# Patient Record
Sex: Male | Born: 1955 | Race: White | Hispanic: No | Marital: Married | Smoking: Never smoker
Health system: Southern US, Community
[De-identification: ages and names within clinical notes are randomized; demographics above are authoritative.]

## PROBLEM LIST (undated history)

## (undated) DIAGNOSIS — E785 Hyperlipidemia, unspecified: Secondary | ICD-10-CM

## (undated) DIAGNOSIS — D126 Benign neoplasm of colon, unspecified: Secondary | ICD-10-CM

## (undated) DIAGNOSIS — N189 Chronic kidney disease, unspecified: Secondary | ICD-10-CM

## (undated) DIAGNOSIS — I10 Essential (primary) hypertension: Secondary | ICD-10-CM

## (undated) DIAGNOSIS — E119 Type 2 diabetes mellitus without complications: Secondary | ICD-10-CM

## (undated) DIAGNOSIS — H269 Unspecified cataract: Secondary | ICD-10-CM

## (undated) DIAGNOSIS — E0590 Thyrotoxicosis, unspecified without thyrotoxic crisis or storm: Secondary | ICD-10-CM

## (undated) HISTORY — PX: POLYPECTOMY: SHX149

## (undated) HISTORY — PX: COLONOSCOPY: SHX174

## (undated) HISTORY — PX: VASECTOMY: SHX75

## (undated) HISTORY — PX: KIDNEY STONE SURGERY: SHX686

## (undated) HISTORY — PX: TONSILLECTOMY: SUR1361

## (undated) HISTORY — PX: HERNIA REPAIR: SHX51

## (undated) HISTORY — DX: Benign neoplasm of colon, unspecified: D12.6

## (undated) HISTORY — DX: Hyperlipidemia, unspecified: E78.5

## (undated) HISTORY — DX: Unspecified cataract: H26.9

## (undated) HISTORY — DX: Thyrotoxicosis, unspecified without thyrotoxic crisis or storm: E05.90

## (undated) HISTORY — DX: Type 2 diabetes mellitus without complications: E11.9

## (undated) HISTORY — DX: Essential (primary) hypertension: I10

---

## 1999-10-28 ENCOUNTER — Ambulatory Visit (HOSPITAL_COMMUNITY): Admission: RE | Admit: 1999-10-28 | Discharge: 1999-10-28 | Payer: Self-pay | Admitting: *Deleted

## 2005-12-22 ENCOUNTER — Ambulatory Visit: Payer: Self-pay | Admitting: Endocrinology

## 2006-01-09 ENCOUNTER — Ambulatory Visit: Payer: Self-pay | Admitting: Endocrinology

## 2006-01-13 ENCOUNTER — Ambulatory Visit: Payer: Self-pay | Admitting: Endocrinology

## 2006-02-06 ENCOUNTER — Ambulatory Visit: Payer: Self-pay | Admitting: Endocrinology

## 2006-02-10 ENCOUNTER — Ambulatory Visit: Payer: Self-pay | Admitting: Endocrinology

## 2006-04-14 ENCOUNTER — Ambulatory Visit: Payer: Self-pay | Admitting: Endocrinology

## 2006-04-14 LAB — CONVERTED CEMR LAB: TSH: 0.86 microintl units/mL (ref 0.35–5.50)

## 2006-04-15 ENCOUNTER — Ambulatory Visit: Payer: Self-pay | Admitting: Endocrinology

## 2006-07-16 ENCOUNTER — Ambulatory Visit: Payer: Self-pay | Admitting: Endocrinology

## 2006-07-16 LAB — CONVERTED CEMR LAB: TSH: 1.64 microintl units/mL (ref 0.35–5.50)

## 2006-11-17 ENCOUNTER — Ambulatory Visit: Payer: Self-pay | Admitting: Endocrinology

## 2006-12-24 ENCOUNTER — Ambulatory Visit: Payer: Self-pay | Admitting: Internal Medicine

## 2007-06-29 ENCOUNTER — Ambulatory Visit: Payer: Self-pay | Admitting: Endocrinology

## 2007-06-29 DIAGNOSIS — E0590 Thyrotoxicosis, unspecified without thyrotoxic crisis or storm: Secondary | ICD-10-CM

## 2007-06-29 DIAGNOSIS — I10 Essential (primary) hypertension: Secondary | ICD-10-CM

## 2007-07-07 LAB — CONVERTED CEMR LAB: TSH: 1.23 microintl units/mL (ref 0.35–5.50)

## 2007-10-07 ENCOUNTER — Emergency Department (HOSPITAL_COMMUNITY): Admission: EM | Admit: 2007-10-07 | Discharge: 2007-10-07 | Payer: Self-pay | Admitting: Emergency Medicine

## 2008-01-04 ENCOUNTER — Ambulatory Visit: Payer: Self-pay | Admitting: Endocrinology

## 2008-01-06 LAB — CONVERTED CEMR LAB: TSH: 1.38 microintl units/mL (ref 0.35–5.50)

## 2008-08-15 ENCOUNTER — Ambulatory Visit: Payer: Self-pay | Admitting: Endocrinology

## 2008-08-15 DIAGNOSIS — E291 Testicular hypofunction: Secondary | ICD-10-CM

## 2008-08-15 LAB — CONVERTED CEMR LAB
ALT: 41 units/L (ref 0–53)
AST: 31 units/L (ref 0–37)
Albumin: 4.2 g/dL (ref 3.5–5.2)
Alkaline Phosphatase: 74 units/L (ref 39–117)
BUN: 17 mg/dL (ref 6–23)
Basophils Absolute: 0 10*3/uL (ref 0.0–0.1)
Basophils Relative: 0.4 % (ref 0.0–3.0)
Bilirubin, Direct: 0.2 mg/dL (ref 0.0–0.3)
CO2: 27 meq/L (ref 19–32)
Calcium: 9.3 mg/dL (ref 8.4–10.5)
Chloride: 101 meq/L (ref 96–112)
Cholesterol: 180 mg/dL (ref 0–200)
Creatinine, Ser: 1.1 mg/dL (ref 0.4–1.5)
Eosinophils Absolute: 0.1 10*3/uL (ref 0.0–0.7)
Eosinophils Relative: 1.5 % (ref 0.0–5.0)
FSH: 2.3 milliintl units/mL (ref 1.4–18.1)
GFR calc non Af Amer: 74.75 mL/min (ref >60)
Glucose, Bld: 117 mg/dL — ABNORMAL HIGH (ref 70–99)
HCT: 44.4 % (ref 39.0–52.0)
HDL: 34.7 mg/dL — ABNORMAL LOW (ref >39.00)
Hemoglobin: 14.9 g/dL (ref 13.0–17.0)
LDL Cholesterol: 121 mg/dL — ABNORMAL HIGH (ref 0–99)
LH: 1.42 milliintl units/mL — ABNORMAL LOW (ref 1.50–9.30)
Lymphocytes Relative: 23.2 % (ref 12.0–46.0)
Lymphs Abs: 1.8 10*3/uL (ref 0.7–4.0)
MCHC: 33.6 g/dL (ref 30.0–36.0)
MCV: 90.2 fL (ref 78.0–100.0)
Monocytes Absolute: 0.5 10*3/uL (ref 0.1–1.0)
Monocytes Relative: 6.7 % (ref 3.0–12.0)
Neutro Abs: 5.4 10*3/uL (ref 1.4–7.7)
Neutrophils Relative %: 68.2 % (ref 43.0–77.0)
PSA: 0.88 ng/mL (ref 0.10–4.00)
Platelets: 214 10*3/uL (ref 150.0–400.0)
Potassium: 4.1 meq/L (ref 3.5–5.1)
Prolactin: 4.7 ng/mL
RBC: 4.92 M/uL (ref 4.22–5.81)
RDW: 12.6 % (ref 11.5–14.6)
Sodium: 138 meq/L (ref 135–145)
TSH: 0.9 microintl units/mL (ref 0.35–5.50)
Testosterone: 278.97 ng/dL — ABNORMAL LOW (ref 350.00–890.00)
Total Bilirubin: 0.8 mg/dL (ref 0.3–1.2)
Total CHOL/HDL Ratio: 5
Total Protein: 7.1 g/dL (ref 6.0–8.3)
Triglycerides: 120 mg/dL (ref 0.0–149.0)
VLDL: 24 mg/dL (ref 0.0–40.0)
WBC: 7.8 10*3/uL (ref 4.5–10.5)

## 2010-02-13 ENCOUNTER — Encounter: Payer: Self-pay | Admitting: Endocrinology

## 2010-02-13 ENCOUNTER — Ambulatory Visit: Payer: Self-pay | Admitting: Endocrinology

## 2010-02-13 DIAGNOSIS — K769 Liver disease, unspecified: Secondary | ICD-10-CM | POA: Insufficient documentation

## 2010-02-13 DIAGNOSIS — E7800 Pure hypercholesterolemia, unspecified: Secondary | ICD-10-CM | POA: Insufficient documentation

## 2010-02-13 HISTORY — DX: Liver disease, unspecified: K76.9

## 2010-02-18 ENCOUNTER — Telehealth: Payer: Self-pay | Admitting: Endocrinology

## 2010-02-19 LAB — CONVERTED CEMR LAB
AST: 50 units/L — ABNORMAL HIGH (ref 0–37)
Alkaline Phosphatase: 100 units/L (ref 39–117)
BUN: 17 mg/dL (ref 6–23)
Basophils Absolute: 0.1 10*3/uL (ref 0.0–0.1)
Bilirubin, Direct: 0.1 mg/dL (ref 0.0–0.3)
Calcium: 9.8 mg/dL (ref 8.4–10.5)
Cholesterol: 191 mg/dL (ref 0–200)
GFR calc non Af Amer: 62.96 mL/min (ref >60)
Glucose, Bld: 284 mg/dL — ABNORMAL HIGH (ref 70–99)
HDL: 37.9 mg/dL — ABNORMAL LOW (ref >39.00)
LDL Cholesterol: 117 mg/dL — ABNORMAL HIGH (ref 0–99)
Lymphocytes Relative: 22.8 % (ref 12.0–46.0)
Monocytes Relative: 6.3 % (ref 3.0–12.0)
Neutrophils Relative %: 68.8 % (ref 43.0–77.0)
PSA: 1.14 ng/mL (ref 0.10–4.00)
Platelets: 199 10*3/uL (ref 150.0–400.0)
RDW: 12.8 % (ref 11.5–14.6)
Specific Gravity, Urine: 1.02 (ref 1.000–1.030)
TSH: 0.94 microintl units/mL (ref 0.35–5.50)
Total Bilirubin: 0.5 mg/dL (ref 0.3–1.2)
Total Protein, Urine: NEGATIVE mg/dL
Urine Glucose: >=1000 mg/dL
Urobilinogen, UA: 0.2 (ref 0.0–1.0)
VLDL: 36.2 mg/dL (ref 0.0–40.0)
WBC: 8.6 10*3/uL (ref 4.5–10.5)
pH: 6 (ref 5.0–8.0)

## 2010-02-20 ENCOUNTER — Ambulatory Visit: Payer: Self-pay | Admitting: Endocrinology

## 2010-06-02 ENCOUNTER — Encounter: Payer: Self-pay | Admitting: Endocrinology

## 2010-06-11 NOTE — Assessment & Plan Note (Signed)
Summary: FU/ DIDN'T WANT A CPX/ LOV 08-2008 /[facility]   Vital Signs:  Patient profile:   55 year old male Height:      67 inches Weight:      187.38 pounds BMI:     29.45 O2 Sat:      96 % Temp:     98.1 degrees F oral Pulse rate:   84 / minute BP sitting:   140 / 70  (left arm) Cuff size:   regular  Vitals Entered By: Jarome Lamas (February 13, 2010 8:15 AM) CC: follow-up visit   CC:  follow-up visit.  History of Present Illness: the status of at least 3 ongoing medical problems is addressed today: hyperthyroidism:  this has been present x approx 11 years.  he chose to stay on thionamide rx, and has tolerated well. dyslipidemia:  this has been noted in the past, but he has not agreed to take medication thus far.  he denies chest pain. hypogonadism:  he declined clomid in the past.  no change in sxs.    Current Medications (verified): 1)  Methimazole 10 Mg Tabs (Methimazole) .... Qd 2)  Zestoretic 20-25 Mg Tabs (Lisinopril-Hydrochlorothiazide) .... Take 1 Tablet By Mouth Once A Day 3)  Levitra 20 Mg Tabs (Vardenafil Hcl) .... As Needed Use  Allergies (verified): No Known Drug Allergies  Past History:  Past Medical History: Last updated: 01/04/2008 HYPERTENSION (ICD-401.9) HYPERTHYROIDISM (ICD-242.90)  Family History: Reviewed history from 08/15/2008 and no changes required. mother (smoker) has "throat cancer." son committed suicide 2010.  Social History: Reviewed history from 08/15/2008 and no changes required. works as Radio broadcast assistant married.  Review of Systems  The patient denies dyspnea on exertion.         denies rash  Physical Exam  General:  normal appearance.   Neck:  Supple without thyroid enlargement or tenderness.  Lungs:  Clear to auscultation bilaterally. Normal respiratory effort.  Heart:  Regular rate and rhythm without murmurs or gallops noted. Normal S1,S2.   Additional Exam:  FastTSH                   0.94 uIU/mL      Testosterone          [L]  193.23 ng/dL      LDL Cholesterol      [H]  811 mg/dL        AST                  [H]  50 U/L                      0-37   ALT                  [H]  54 U/L         Impression & Recommendations:  Problem # 1:  HYPERTHYROIDISM (ICD-242.90) well-controlled  Problem # 2:  HYPERCHOLESTEROLEMIA (ICD-272.0) needs increased rx  Problem # 3:  HYPOGONADISM (ICD-257.2) persistent  Problem # 4:  UNSPECIFIED DISORDER OF LIVER (ICD-573.9) Assessment: New  Other Orders: EKG w/ Interpretation (93000) TLB-Lipid Panel (80061-LIPID) TLB-BMP (Basic Metabolic Panel-BMET) (80048-METABOL) TLB-CBC Platelet - w/Differential (85025-CBCD) TLB-Hepatic/Liver Function Pnl (80076-HEPATIC) TLB-TSH (Thyroid Stimulating Hormone) (84443-TSH) TLB-Testosterone, Total (84403-TESTO) TLB-Udip w/ Micro (81001-URINE) TLB-PSA (Prostate Specific Antigen) (84153-PSA) Est. Patient Level IV (91478)  Patient Instructions: 1)  blood tests are being ordered for you today.  please call [phone] to hear your test results. 2)  pending  the test results, please continue the same medications for now. 3)  if ever you have fever while taking this methimazole, stop it and call us, because of the risk of a rare side-effect. 4)  Please schedule a follow-up appointment in 3 months. 5)  update: pt is called.  please consider med for chol, testosterone.  go to lab for repeat liver panel, hepatitis-c antibody, hepatitis-b surface antigen, and hepatitis-b surface antibody 573.9

## 2010-06-11 NOTE — Progress Notes (Signed)
Summary: Lab results/SAE pt  Phone Note Call from Patient   Caller: Patient [phone] c Summary of Call: Pt called requesting Lab results from CPX last week. Can MD review and advise? Initial call taken by: Margaret Pyle, CMA,  February 18, 2010 9:06 AM  Follow-up for Phone Call        prelim review shows mild elev chol, slight elevation of liver testss, and low testosterone level -   Results Not urgent at this time;  ok to wait for Dr Everardo All to address on return Follow-up by: Corwin Levins MD,  February 18, 2010 9:09 AM  Additional Follow-up for Phone Call Additional follow up Details #1::        Pt advised of above and refills. Pt will wait for SAE return. Additional Follow-up by: Margaret Pyle, CMA,  February 18, 2010 9:28 AM    Additional Follow-up for Phone Call Additional follow up Details #2::    please call patient: testosterone is still low, and cholesterol is high.  please consider a pill for each of these.  also, liver is slightly off--this usually resolves on its own.  please come in for labs (listed on ov, and sent to [facility]ancy for entry into idx) Follow-up by: Minus Breeding MD,  February 19, 2010 6:51 PM  Additional Follow-up for Phone Call Additional follow up Details #3:: Details for Additional Follow-up Action Taken: Pt advised of above, declined medications for chol and testosterone at this time. Labs scheduled and pt aware. Additional Follow-up by: Margaret Pyle, CMA,  February 20, 2010 8:59 AM  Prescriptions: LEVITRA 20 MG TABS (VARDENAFIL HCL) as needed use  #3 x 11   Entered by:   Margaret Pyle, CMA   Authorized by:   Minus Breeding MD   Signed by:   Margaret Pyle, CMA on 02/18/2010   Method used:   Electronically to        [facility]  [address] [phone]* (retail)       [address]       [place], [place]  [address]       Ph: [phone]       Fax: [phone]   RxID:   [id number] ZESTORETIC  20-25 MG TABS (LISINOPRIL-HYDROCHLOROTHIAZIDE) Take 1 tablet by mouth once a day  #30 Tablet x 11   Entered by:   Margaret Pyle, CMA   Authorized by:   Minus Breeding MD   Signed by:   Margaret Pyle, CMA on 02/18/2010   Method used:   Electronically to        [facility]  [address] [phone]* (retail)       [address]       [place], [place]  [address]       Ph: [phone]       Fax: [phone]   RxID:   [id number]

## 2010-09-27 NOTE — Consult Note (Signed)
[facility]                            ENDOCRINOLOGY CONSULTATION   TRIGG, DELAROCHA                      MRN:          [id number]  DATE:12/23/2005                            DOB:          January 17, 1956    REFERRING PHYSICIAN:  Oley Balm. Georgina Pillion, MD   REASON FOR VISIT:  Hyperthyroidism.   HISTORY OF PRESENT ILLNESS:  A 55 year old man with an approximately 7 years  history of hyperthyroidism. He was treated initially with Thionamide  medications and he improved. He declined Iodine-131 therapy. He went off the  medication altogether about 2 years ago and now, he has several years of  tremor of his hands with associated moderate heat intolerance.   PAST MEDICAL HISTORY:  Hypertension, for which he takes Lisinopril/HCTZ.   SOCIAL HISTORY:  He works as a Visual merchandiser. He is married.   FAMILY HISTORY:  Negative for thyroid disease.   REVIEW OF SYSTEMS:  Denies the following:  Fever, weight gain, weight loss,  syncope, palpitations, shortness of breath, nausea, vomiting, itching, and  anxiety.   PHYSICAL EXAMINATION:  VITAL SIGNS:  Blood pressure 137/76, heart rate 119,  temperature 98.3, weight 176.  GENERAL:  No distress.  SKIN:  Not diaphoretic.  HEENT:  Eyes, no proptosis. No periorbital swelling.  NECK:  The thyroid is slightly and diffusely enlarged.  CHEST:  Clear to auscultation. No respiratory distress.  CARDIOVASCULAR:  No JVD. No edema. Regular rate and rhythm. No murmur.  NEUROLOGIC:  Gait is observed in the office to be normal. Alert and  oriented. Does not appear anxious nor depressed and there is no tremor.   LABORATORY DATA:  Forwarded by Dr. Georgina Pillion. On October 20, 2005, TSH 0.02. Free  T4 1.69, which is elevated.   IMPRESSION:  1. Apparent history of Graves' disease.  2. History of hyperthyroidism due to Graves' disease.  3. Hypertension, for which he takes Lisinopril/HCTZ.   PLAN:  1. We discussed the natural  history, risks, and treatment options for his      hyperthyroidism, presumably due to Graves' disease.  2. He states that he wants to resume Thionamide therapy for now, so he      will take Tapazole 20 mg twice a day.  3. Return in 3 weeks.  4. I told him because of the rare side effect of Tapazole, if he has      fever, he must discontinue it and call us, so we can check a CBC.  5. I considered beta blockade but I hesitate to slow the heart rate of      somebody who works in a warm environment in the summer time, as this      could increase his risk for syncope.                                   Sean A. Everardo All, MD   SAE/MedQ  DD:  12/23/2005  DT:  12/23/2005  Job #:  [id number]

## 2010-09-27 NOTE — Consult Note (Signed)
[facility]                            ENDOCRINOLOGY CONSULTATION   HENRY, UTSEY                      MRN:          [id number]  DATE:02/10/2006                            DOB:          07-22-1955    REASON FOR VISIT:  Follow up thyroid.   HISTORY OF PRESENT ILLNESS:  [age]-year-old man who states he feels no  different, and well in general.   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   FAMILY HISTORY:  His brother has type 1 diabetes.   REVIEW OF SYSTEMS:  Denies any change in his weight.   PHYSICAL EXAMINATION:  Blood pressure 103/68, heart rate 80, temperature  97.0, weight 174.  GENERAL:  No distress.  NECK:  Small multinodular goiter.  NEUROLOGIC:  Alert, well-oriented.  Does not appear anxious nor depressed,  and there is no tremor.   LABORATORY DATA:  Hemoglobin A1c 5.8, TSH 0.02, free T4 0.8.   IMPRESSION:  1. Continued improvement in his hyperthyroidism.  2. Hyperglycemia.   PLAN:  1. Decrease Tapazole to 10 mg a day.  2. Diet and exercise therapy is advised.  3. Return in about 6 weeks.            ______________________________  Cleophas Dunker Everardo All, MD     SAE/MedQ  DD:  02/11/2006  DT:  02/12/2006  Job #:  [id number]

## 2010-09-27 NOTE — Consult Note (Signed)
[facility]                          ENDOCRINOLOGY CONSULTATION   Andrew Morrow, Andrew Morrow                      MRN:          [id number]  DATE:04/15/2006                            DOB:          1956/03/04    REASON FOR VISIT:  Followup thyroid.   HISTORY OF PRESENT ILLNESS:  A 55 year old man whose Tapazole is 10 mg a  day.  He states he feels no different, and well in general.   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   PAST MEDICAL HISTORY:  Otherwise healthy, except for hypertension for  which he takes Zestoretic 20/12.5, one daily.   REVIEW OF SYSTEMS:  Denies fever.   PHYSICAL EXAMINATION:  VITAL SIGNS:  Blood pressure 122/80, heart rate  is 79, temperature 97.9.  The weight is 177.  GENERAL:  No distress.  NECK:  Small multi-nodular goiter.   LABORATORY STUDIES:  On 04/14/2006, TSH 0.86, hemoglobin A1c 5.9.   IMPRESSION:  1. Well-controlled hypertension.  2. Well-controlled hyperthyroidism, due to a multi-nodular goiter      which is usually hereditary.  3. Hyperglycemia.  No evidence of dibs.   PLAN:  1. Same amount of Tapazole and Zestoretic.  2. Return in 3 months.  3. 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     Sean A. Everardo All, MD  Electronically Signed    SAE/MedQ  DD: 04/15/2006  DT: 04/16/2006  Job #: [id number]   cc:   Andrew Morrow

## 2010-09-27 NOTE — Consult Note (Signed)
[facility]                            ENDOCRINOLOGY CONSULTATION   Andrew Morrow                      MRN:          [id number]  DATE:01/13/2006                            DOB:          [birth year]    REASON FOR VISIT:  Followup thyroid.   HISTORY OF PRESENT ILLNESS:  A 55 year old man with hyperthyroidism.  He is  back on Tapazole 20 mg twice daily and feels better.  He denies tremor and  palpitations.  He also states six months of ED.   PAST MEDICAL HISTORY:  Same as December 23, 2005.   REVIEW OF SYSTEMS:  Denies fever.   PHYSICAL EXAMINATION:  VITAL SIGNS:  Blood pressure 124/75, heart rate 94,  temperature 97.4.  The weight is 178.  GENERAL:  In no distress.  NECK:  Small multinodular goiter.  SKIN:  Not diaphoretic.  NEUROLOGIC:  Alert and oriented.  Does not appear anxious or depressed.  A  minimal postural tremor present.   LABORATORY STUDIES:  On January 09, 2006, free T4 1.4.  TSH 0.03.  Testosterone 299.58.  FSH 2.5, LH 1.9, prolactin 6.4.   IMPRESSION:  1. Hyperthyroidism, improved.  2. Mild hypogonadism.   PLAN:  1. Decrease Tapazole to 10 mg twice daily.  2. I advise against testosterone supplementation in this setting.  I have      told him it is fine for him to take Viagra, and I gave him a few      samples.  3. Return in six weeks with thyroid function studies prior.                                   Sean A. Everardo All, MD   SAE/MedQ  DD:  01/14/2006  DT:  01/14/2006  Job #:  [id number]   cc:   Oley Balm. Georgina Pillion, M.D.

## 2010-11-27 ENCOUNTER — Telehealth: Payer: Self-pay | Admitting: *Deleted

## 2010-11-27 NOTE — Telephone Encounter (Signed)
i have several patients with this last name.  Please verify, as this pt does not have dm

## 2010-11-27 NOTE — Telephone Encounter (Signed)
Pt c/o elevated BSL. [362] this AM, then went to [500] at 10:15am Do you want OV for pt?

## 2010-11-28 ENCOUNTER — Encounter: Payer: Self-pay | Admitting: *Deleted

## 2010-11-28 ENCOUNTER — Ambulatory Visit (INDEPENDENT_AMBULATORY_CARE_PROVIDER_SITE_OTHER): Payer: 59 | Admitting: Endocrinology

## 2010-11-28 ENCOUNTER — Other Ambulatory Visit (INDEPENDENT_AMBULATORY_CARE_PROVIDER_SITE_OTHER): Payer: 59

## 2010-11-28 ENCOUNTER — Encounter: Payer: Self-pay | Admitting: Endocrinology

## 2010-11-28 ENCOUNTER — Telehealth: Payer: Self-pay | Admitting: *Deleted

## 2010-11-28 VITALS — BP 140/82 | HR 86 | Temp 98.6°F | Ht 67.0 in | Wt 179.2 lb

## 2010-11-28 DIAGNOSIS — E109 Type 1 diabetes mellitus without complications: Secondary | ICD-10-CM

## 2010-11-28 DIAGNOSIS — E0590 Thyrotoxicosis, unspecified without thyrotoxic crisis or storm: Secondary | ICD-10-CM

## 2010-11-28 LAB — BASIC METABOLIC PANEL
Chloride: 94 mEq/L — ABNORMAL LOW (ref 96–112)
Creatinine, Ser: 1.1 mg/dL (ref 0.4–1.5)
Sodium: 133 mEq/L — ABNORMAL LOW (ref 135–145)

## 2010-11-28 MED ORDER — GLUCOSE BLOOD VI STRP: 1.0000 | ORAL_STRIP | Freq: Four times a day (QID) | Status: DC

## 2010-11-28 MED ORDER — INSULIN GLARGINE 100 UNIT/ML ~~LOC~~ SOLN: 10.0000 [IU] | Freq: Every day | SUBCUTANEOUS | Status: DC

## 2010-11-28 NOTE — Patient Instructions (Addendum)
Please start lantus 10 units daily. Refer to a diabetes educator to learn about injections.  you will be called with a day and time for an appointment. Here is a meter to check your blood sugar. check your blood sugar 4 times a day--before the 3 meals, and at bedtime.  also check if you have symptoms of your blood sugar being too high or too low.  please keep a record of the readings and bring it to your next appointment here.  please call us sooner if you are having low blood sugar episodes. Return here in 4 days. blood tests are being ordered for you today.  please call [phone] to hear your test results.  You will be prompted to enter the 9-digit "MRN" number that appears at the top left of this page, followed by #.  Then you will hear the message. We'll recheck blood pressure when you feel better.

## 2010-11-28 NOTE — Progress Notes (Signed)
  Subjective:    Patient ID: Andrew Morrow, male    DOB: [birth year], 55 y.o.   MRN: [id number]  HPI Pt states few weeks of slight dryness of the mouth and assoc polyuria.  Denies sob and fever. Past Medical History  Diagnosis Date  . HTN (hypertension)   . Hyperthyroidism     Past Surgical History  Procedure Date  . None reported     History   Social History  . Marital Status: Married    Spouse Name: N/A    Number of Children: N/A  . Years of Education: N/A   Occupational History  . Not on file.   Social History Main Topics  . Smoking status: Never Smoker   . Smokeless tobacco: Not on file  . Alcohol Use: Not on file  . Drug Use: Not on file  . Sexually Active: Not on file   Other Topics Concern  . Not on file   Social History Narrative   Works as Nurse, adult    No current outpatient prescriptions on file prior to visit.    No Known Allergies  Family History  Problem Relation Age of Onset  . Throat cancer Mother   . Other Son     Committed Suicide  brother had type 1 dm.  BP 140/82  Pulse 86  Temp(Src) 98.6 F (37 C) (Oral)  Ht 5\' 7"  (1.702 m)  Wt 179 lb 3.2 oz (81.285 kg)  BMI 28.07 kg/m2  SpO2 96%  Review of Systems He has lost 8 lbs since last ov.  Denies n/v    Objective:   Physical Exam GENERAL: no distress LUNGS:  Clear to auscultation HEART: Regular rate and rhythm without murmurs noted. Normal S1,S2.   Pulses: dorsalis pedis intact bilat.   Feet: no deformity.  no ulcer on the feet.  feet are of normal color and temp.  no edema Neuro: sensation is intact to touch on the feet   Labs: glucose=422 Lab Results  Component Value Date   TSH 1.13 11/28/2010   Assessment & Plan:  Dm, worse. i have demonstrated insulin pen today Htn, ? Situational component. Hypothyroidism, well-replaced

## 2010-11-28 NOTE — Telephone Encounter (Signed)
Forwarded to scheduling for appointment.

## 2010-11-28 NOTE — Telephone Encounter (Signed)
Same Day Abstraction. 

## 2010-11-29 ENCOUNTER — Ambulatory Visit: Payer: Self-pay | Admitting: Endocrinology

## 2010-12-02 ENCOUNTER — Other Ambulatory Visit: Payer: Self-pay | Admitting: Endocrinology

## 2010-12-02 ENCOUNTER — Ambulatory Visit (INDEPENDENT_AMBULATORY_CARE_PROVIDER_SITE_OTHER): Payer: 59 | Admitting: Endocrinology

## 2010-12-02 ENCOUNTER — Encounter: Payer: Self-pay | Admitting: Endocrinology

## 2010-12-02 DIAGNOSIS — E109 Type 1 diabetes mellitus without complications: Secondary | ICD-10-CM

## 2010-12-02 NOTE — Progress Notes (Signed)
  Subjective:    Patient ID: Andrew Morrow, male    DOB: [birth year], 55 y.o.   MRN: [id number]  HPI pt states he feels less tired in general.  On lantus 10 unit qd, cbg's are mostly in the 300's.  There is no trend throughout the day.  He has no trouble using the pen. Past Medical History  Diagnosis Date  . HTN (hypertension)   . Hyperthyroidism     Past Surgical History  Procedure Date  . None reported     History   Social History  . Marital Status: Married    Spouse Name: N/A    Number of Children: N/A  . Years of Education: N/A   Occupational History  . Not on file.   Social History Main Topics  . Smoking status: Never Smoker   . Smokeless tobacco: Not on file  . Alcohol Use: Not on file  . Drug Use: Not on file  . Sexually Active: Not on file   Other Topics Concern  . Not on file   Social History Narrative   Works as Nurse, adult    Current Outpatient Prescriptions on File Prior to Visit  Medication Sig Dispense Refill  . glucose blood (ONE TOUCH ULTRA TEST) test strip 1 each by Other route 4 (four) times daily. And lancets 250.03  100 each  12  . insulin glargine (LANTUS SOLOSTAR) 100 UNIT/ML injection Inject 10 Units into the skin at bedtime. And pen needles, 1 daily  15 mL  12  . lisinopril-hydrochlorothiazide (PRINZIDE,ZESTORETIC) 20-25 MG per tablet Take 1 tablet by mouth daily.        . methimazole (TAPAZOLE) 10 MG tablet Take 10 mg by mouth daily.        . vardenafil (LEVITRA) 20 MG tablet Take 20 mg by mouth daily as needed.          No Known Allergies  Family History  Problem Relation Age of Onset  . Throat cancer Mother   . Other Son     Committed Suicide    BP 130/82  Pulse 92  Temp(Src) 98.5 F (36.9 C) (Oral)  Ht 5\' 7"  (1.702 m)  Wt 179 lb (81.194 kg)  BMI 28.04 kg/m2  SpO2 96% Review of Systems Denies n/v    Objective:   Physical Exam GENERAL: no distress Gait, normal and steady.    Assessment & Plan:  Type  1 dm, needs increased rx

## 2010-12-02 NOTE — Patient Instructions (Addendum)
Increase lantus to 15 units daily.  Then if necessary, increase to 20 units daily, and then to 25.  Our goal is to get the blood sugar in the low-100's, at some time of day.   Please make a follow-up appointment in 1 week.   check your blood sugar 4 times a day--before the 3 meals, and at bedtime.  also check if you have symptoms of your blood sugar being too high or too low.  please keep a record of the readings and bring it to your next appointment here.  please call us sooner if you are having low blood sugar episodes.

## 2010-12-03 ENCOUNTER — Other Ambulatory Visit: Payer: Self-pay

## 2010-12-03 MED ORDER — ONETOUCH DELICA LANCETS MISC: 1.0000 | Freq: Four times a day (QID) | Status: DC

## 2010-12-10 ENCOUNTER — Ambulatory Visit (INDEPENDENT_AMBULATORY_CARE_PROVIDER_SITE_OTHER): Payer: 59 | Admitting: Endocrinology

## 2010-12-10 ENCOUNTER — Encounter: Payer: Self-pay | Admitting: Endocrinology

## 2010-12-10 VITALS — BP 134/78 | HR 83 | Temp 98.4°F | Ht 67.0 in | Wt 179.0 lb

## 2010-12-10 DIAGNOSIS — E109 Type 1 diabetes mellitus without complications: Secondary | ICD-10-CM

## 2010-12-10 MED ORDER — INSULIN LISPRO 100 UNIT/ML ~~LOC~~ SOLN: 4.0000 [IU] | Freq: Three times a day (TID) | SUBCUTANEOUS | Status: DC

## 2010-12-10 NOTE — Patient Instructions (Addendum)
reduce lantus to 5 units daily.   Start novolog 4 units 3x a day (just before each meal). Please make a follow-up appointment in 1 week.   check your blood sugar 4 times a day--before the 3 meals, and at bedtime.  also check if you have symptoms of your blood sugar being too high or too low.  please keep a record of the readings and bring it to your next appointment here.  please call us sooner if you are having low blood sugar episodes.

## 2010-12-10 NOTE — Progress Notes (Signed)
  Subjective:    Patient ID: Andrew Morrow, male    DOB: 19-Apr-1956, 55 y.o.   MRN: [id number]  HPI Pt feels better since last ov.  He takes lantus 15 units qd.  no cbg record, but states cbg's vary from 100-200.  It is in general higher as the day goes on.   Past Medical History  Diagnosis Date  . HTN (hypertension)   . Hyperthyroidism     Past Surgical History  Procedure Date  . None reported     History   Social History  . Marital Status: Married    Spouse Name: N/A    Number of Children: N/A  . Years of Education: N/A   Occupational History  . Not on file.   Social History Main Topics  . Smoking status: Never Smoker   . Smokeless tobacco: Not on file  . Alcohol Use: Not on file  . Drug Use: Not on file  . Sexually Active: Not on file   Other Topics Concern  . Not on file   Social History Narrative   Works as Nurse, adult    Current Outpatient Prescriptions on File Prior to Visit  Medication Sig Dispense Refill  . glucose blood (ONE TOUCH ULTRA TEST) test strip 1 each by Other route 4 (four) times daily. And lancets 250.03  100 each  12  . insulin glargine (LANTUS) 100 UNIT/ML injection Inject 5 Units into the skin at bedtime. And pen needles, 1 daily      . lisinopril-hydrochlorothiazide (PRINZIDE,ZESTORETIC) 20-25 MG per tablet Take 1 tablet by mouth daily.        . methimazole (TAPAZOLE) 10 MG tablet TAKE 1 TABLET EVERYDAY  120 tablet  1  . ONETOUCH DELICA LANCETS MISC 1 each by Does not apply route 4 (four) times daily.  400 each  1  . vardenafil (LEVITRA) 20 MG tablet Take 20 mg by mouth daily as needed.         No Known Allergies  Family History  Problem Relation Age of Onset  . Throat cancer Mother   . Other Son     Committed Suicide    BP 134/78  Pulse 83  Temp(Src) 98.4 F (36.9 C) (Oral)  Ht 5\' 7"  (1.702 m)  Wt 179 lb (81.194 kg)  BMI 28.04 kg/m2  SpO2 96%  Review of Systems denies hypoglycemia    Objective:   Physical Exam GENERAL: no distress Gait: normal and steady    Assessment & Plan:  Adult-onset type 1 dm.  he needs to start mealtime insulin.

## 2011-01-09 ENCOUNTER — Ambulatory Visit (INDEPENDENT_AMBULATORY_CARE_PROVIDER_SITE_OTHER): Payer: 59 | Admitting: Endocrinology

## 2011-01-09 ENCOUNTER — Encounter: Payer: Self-pay | Admitting: Endocrinology

## 2011-01-09 DIAGNOSIS — E109 Type 1 diabetes mellitus without complications: Secondary | ICD-10-CM

## 2011-01-09 NOTE — Patient Instructions (Addendum)
reduce lantus to 4 units daily.   continue novolog 3x a day (just before each meal), 4-4-5 units.   Please make a follow-up appointment in 1 month.   check your blood sugar 4 times a day--before the 3 meals, and at bedtime.  also check if you have symptoms of your blood sugar being too high or too low.  please keep a record of the readings and bring it to your next appointment here.  please call us sooner if you are having low blood sugar episodes.

## 2011-01-09 NOTE — Progress Notes (Signed)
  Subjective:    Patient ID: Andrew Morrow, male    DOB: 1955-07-15, 55 y.o.   MRN: [id number]  HPI    Review of Systems     Objective:   Physical Exam        Assessment & Plan:  Dm, he needs some adjustment in his therapy

## 2011-01-09 NOTE — Progress Notes (Signed)
  Subjective:    Patient ID: Andrew Morrow, male    DOB: 1955/08/28, 55 y.o.   MRN: [id number]  HPI pt states she feels well in general.  he brings a record of his cbg's which i have reviewed today.  It varies from 90-150.  It is highest at hs, and lowest in the afternoon.     Review of Systems denies hypoglycemia.      Objective:   Physical Exam VITAL SIGNS:  See vs page GENERAL: no distress.       Assessment & Plan:

## 2011-02-05 ENCOUNTER — Encounter: Payer: Self-pay | Admitting: Endocrinology

## 2011-02-05 ENCOUNTER — Ambulatory Visit (INDEPENDENT_AMBULATORY_CARE_PROVIDER_SITE_OTHER): Payer: 59 | Admitting: Endocrinology

## 2011-02-05 VITALS — BP 122/82 | HR 75 | Temp 98.2°F | Ht 67.0 in | Wt 172.8 lb

## 2011-02-05 DIAGNOSIS — E109 Type 1 diabetes mellitus without complications: Secondary | ICD-10-CM

## 2011-02-05 MED ORDER — GLUCOSE BLOOD VI STRP: 1.0000 | ORAL_STRIP | Freq: Four times a day (QID) | Status: DC

## 2011-02-05 NOTE — Patient Instructions (Addendum)
continue lantus, 4 units daily.   reduce novolog to 3x a day (just before each meal), 4-3-5 units.   Please make a follow-up appointment in January.    check your blood sugar 4 times a day--before the 3 meals, and at bedtime.  also check if you have symptoms of your blood sugar being too high or too low.  please keep a record of the readings and bring it to your next appointment here.  please call us sooner if you are having low blood sugar episodes.

## 2011-02-05 NOTE — Progress Notes (Signed)
  Subjective:    Patient ID: Andrew Morrow, male    DOB: [birth year], 55 y.o.   MRN: [id number]  HPI pt states he feels well in general.  he brings a record of his cbg's which i have reviewed today.  It varies from 80 (afternoon) to 179 (hs).  However, almost all are approx 100.   Past Medical History  Diagnosis Date  . HTN (hypertension)   . Hyperthyroidism     Past Surgical History  Procedure Date  . None reported     History   Social History  . Marital Status: Married    Spouse Name: N/A    Number of Children: N/A  . Years of Education: N/A   Occupational History  . Not on file.   Social History Main Topics  . Smoking status: Never Smoker   . Smokeless tobacco: Not on file  . Alcohol Use: Not on file  . Drug Use: Not on file  . Sexually Active: Not on file   Other Topics Concern  . Not on file   Social History Narrative   Works as Nurse, adult    Current Outpatient Prescriptions on File Prior to Visit  Medication Sig Dispense Refill  . insulin glargine (LANTUS) 100 UNIT/ML injection Inject 4 Units into the skin at bedtime. And pen needles, 1 daily      . insulin lispro (HUMALOG) 100 UNIT/ML injection Inject into the skin 3 (three) times daily before meals. 4 units with breakfast, 3 with lunch, and 5 with evening meal.  And pen needles 4/day      . lisinopril-hydrochlorothiazide (PRINZIDE,ZESTORETIC) 20-25 MG per tablet Take 1 tablet by mouth daily.        . methimazole (TAPAZOLE) 10 MG tablet TAKE 1 TABLET EVERYDAY  120 tablet  1  . ONETOUCH DELICA LANCETS MISC 1 each by Does not apply route 4 (four) times daily.  400 each  1  . vardenafil (LEVITRA) 20 MG tablet Take 20 mg by mouth daily as needed.          No Known Allergies  Family History  Problem Relation Age of Onset  . Throat cancer Mother   . Other Son     Committed Suicide    BP 122/82  Pulse 75  Temp(Src) 98.2 F (36.8 C) (Oral)  Ht 5\' 7"  (1.702 m)  Wt 172 lb 12.8 oz (78.382  kg)  BMI 27.06 kg/m2  SpO2 99%  Review of Systems denies hypoglycemia.    Objective:   Physical Exam Pulses: dorsalis pedis intact bilat.   Feet: no deformity.  no ulcer on the feet.  feet are of normal color and temp.  no edema Neuro: sensation is intact to touch on the feet    Assessment & Plan:  Adult-onset type 1 DM.  The pattern of his cbg's indicates he needs only a slight adjustment in his therapy.

## 2011-03-29 ENCOUNTER — Other Ambulatory Visit: Payer: Self-pay | Admitting: Endocrinology

## 2011-06-04 ENCOUNTER — Encounter: Payer: Self-pay | Admitting: Endocrinology

## 2011-06-04 ENCOUNTER — Ambulatory Visit (INDEPENDENT_AMBULATORY_CARE_PROVIDER_SITE_OTHER): Payer: Managed Care, Other (non HMO) | Admitting: Endocrinology

## 2011-06-04 ENCOUNTER — Other Ambulatory Visit (INDEPENDENT_AMBULATORY_CARE_PROVIDER_SITE_OTHER): Payer: Managed Care, Other (non HMO)

## 2011-06-04 DIAGNOSIS — E0590 Thyrotoxicosis, unspecified without thyrotoxic crisis or storm: Secondary | ICD-10-CM

## 2011-06-04 DIAGNOSIS — E109 Type 1 diabetes mellitus without complications: Secondary | ICD-10-CM

## 2011-06-04 MED ORDER — GLUCOSE BLOOD VI STRP: ORAL_STRIP | Status: DC

## 2011-06-04 NOTE — Progress Notes (Signed)
  Subjective:    Patient ID: Andrew Morrow, male    DOB: 04-23-1956, 56 y.o.   MRN: [id number]  HPI The state of at least three ongoing medical problems is addressed today: Pt returns for f/u of type1 DM (2011).  no cbg record, but states cbg's are well-controlled.  He seldom has hypoglycemia, and these episodes are mild (cbg was 68).  He says there is no trend to the cbg's throughout the day. HTN: Denies cough Hyperthyroidism: Denies palpitations.    Review of Systems Denies fever.  He has lost a few lbs, due to his efforts.      Objective:   Physical Exam VITAL SIGNS:  See vs page GENERAL: no distress Pulses: dorsalis pedis intact bilat.   Feet: no deformity.  no ulcer on the feet.  feet are of normal color and temp.  no edema Neuro: sensation is intact to touch on the feet.    Lab Results  Component Value Date   TSH 1.25 06/04/2011   Lab Results  Component Value Date   HGBA1C 5.8 06/04/2011      Assessment & Plan:  Type 1 dm, overcontrolled Hyperthyroidism overcontrolled Htn, well-controlled

## 2011-06-04 NOTE — Patient Instructions (Addendum)
blood tests are being requested for you today.  please call [phone] to hear your test results.  You will be prompted to enter the 9-digit "MRN" number that appears at the top left of this page, followed by #.  Then you will hear the message. pending the test results, please: continue lantus, 4 units daily.   continue novolog 3x a day (just before each meal), 4-3-5 units.   Please make a regular physical appointment in 3 months.   check your blood sugar 4 times a day--before the 3 meals, and at bedtime.  also check if you have symptoms of your blood sugar being too high or too low.  please keep a record of the readings and bring it to your next appointment here.  please call us sooner if you are having low blood sugar episodes.  (update: i left message on phone-tree:  Reduce lunch humalog to 2 units.  Call for hypoglycemia)

## 2011-06-20 ENCOUNTER — Telehealth: Payer: Self-pay | Admitting: *Deleted

## 2011-06-20 DIAGNOSIS — E291 Testicular hypofunction: Secondary | ICD-10-CM

## 2011-06-20 DIAGNOSIS — E109 Type 1 diabetes mellitus without complications: Secondary | ICD-10-CM

## 2011-06-20 DIAGNOSIS — Z0000 Encounter for general adult medical examination without abnormal findings: Secondary | ICD-10-CM

## 2011-06-20 DIAGNOSIS — Z0389 Encounter for observation for other suspected diseases and conditions ruled out: Secondary | ICD-10-CM

## 2011-06-20 NOTE — Telephone Encounter (Signed)
Message copied by Carin Primrose on Fri Jun 20, 2011  9:58 AM ------      Message from: Etheleen Sia      Created: Wed Jun 04, 2011  8:05 AM      Regarding: PHYSICAL LABS, A1C? ,MALB?        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

## 2011-06-20 NOTE — Telephone Encounter (Signed)
Labs placed into Epic for upcoming CPX appointment.  

## 2011-09-02 ENCOUNTER — Encounter: Payer: Managed Care, Other (non HMO) | Admitting: Endocrinology

## 2011-10-07 ENCOUNTER — Other Ambulatory Visit: Payer: Self-pay | Admitting: Endocrinology

## 2011-12-15 ENCOUNTER — Other Ambulatory Visit: Payer: Self-pay | Admitting: Endocrinology

## 2011-12-29 ENCOUNTER — Other Ambulatory Visit: Payer: Self-pay | Admitting: Endocrinology

## 2012-02-23 ENCOUNTER — Other Ambulatory Visit: Payer: Self-pay | Admitting: Endocrinology

## 2012-02-27 ENCOUNTER — Encounter: Payer: Self-pay | Admitting: Endocrinology

## 2012-02-27 ENCOUNTER — Ambulatory Visit (INDEPENDENT_AMBULATORY_CARE_PROVIDER_SITE_OTHER): Payer: 59 | Admitting: Endocrinology

## 2012-02-27 VITALS — BP 132/74 | HR 76 | Temp 98.3°F | Wt 164.0 lb

## 2012-02-27 DIAGNOSIS — E0590 Thyrotoxicosis, unspecified without thyrotoxic crisis or storm: Secondary | ICD-10-CM

## 2012-02-27 DIAGNOSIS — E109 Type 1 diabetes mellitus without complications: Secondary | ICD-10-CM

## 2012-02-27 LAB — TSH: TSH: 3.195 u[IU]/mL (ref 0.350–4.500)

## 2012-02-27 NOTE — Patient Instructions (Addendum)
Please come back for a follow-up appointment in 3 months.   please consider these measures for your health:  minimize alcohol.  do not use tobacco products.  have a colonoscopy at least every 10 years from age 55.  keep firearms safely stored.  always use seat belts.  have working smoke alarms in your home.  see an eye doctor and dentist regularly.  never drive under the influence of alcohol or drugs (including prescription drugs).  those with fair skin should take precautions against the sun.   check your blood sugar 4 times a day--before the 3 meals, and at bedtime.  also check if you have symptoms of your blood sugar being too high or too low.  please keep a record of the readings and bring it to your next appointment here.  please call us sooner if you are having low blood sugar episodes.  blood tests are being requested for you today.  You will be contacted with results.

## 2012-02-27 NOTE — Progress Notes (Signed)
Subjective:    Patient ID: Andrew Morrow, male    DOB: November 15, 1955, 56 y.o.   MRN: [id number]  HPI efforts.   Past Medical History  Diagnosis Date  . HTN (hypertension)   . Hyperthyroidism     Past Surgical History  Procedure Date  . None reported     History   Social History  . Marital Status: Married    Spouse Name: N/A    Number of Children: N/A  . Years of Education: N/A   Occupational History  . Not on file.   Social History Main Topics  . Smoking status: Never Smoker   . Smokeless tobacco: Not on file  . Alcohol Use: Not on file  . Drug Use: Not on file  . Sexually Active: Not on file   Other Topics Concern  . Not on file   Social History Narrative   Works as Nurse, adult    Current Outpatient Prescriptions on File Prior to Visit  Medication Sig Dispense Refill  . glucose blood (ONE TOUCH ULTRA TEST) test strip 5/day, and ;lancets 5/day  250.03  200 each  12  . HUMALOG KWIKPEN 100 UNIT/ML injection INJECT 4 UNITS INTO THE SKIN 3 (THREE) TIMES DAILY BEFORE MEALS  15 Syringe  12  . insulin glargine (LANTUS) 100 UNIT/ML injection Inject 3 Units into the skin at bedtime. And pen needles, 1 daily      . insulin lispro (HUMALOG) 100 UNIT/ML injection Inject into the skin 3 (three) times daily before meals. 3 units with breakfast, 1 with lunch, and 4 with evening meal.  And pen needles 4/day      . Insulin Pen Needle (B-D ULTRAFINE III SHORT PEN) 31G X 8 MM MISC Use as directed four times daily  120 each  5  . lisinopril-hydrochlorothiazide (PRINZIDE,ZESTORETIC) 20-25 MG per tablet TAKE 1 TABLET BY MOUTH EVERY DAY  30 tablet  4  . methimazole (TAPAZOLE) 10 MG tablet TAKE 1 TABLET EVERYDAY  120 tablet  1  . vardenafil (LEVITRA) 20 MG tablet Take 20 mg by mouth daily as needed.          No Known Allergies  Family History  Problem Relation Age of Onset  . Throat cancer Mother   . Other Son     Committed Suicide    BP 132/74  Pulse 76  Temp  98.3 F (36.8 C) (Oral)  Wt 164 lb (74.39 kg)  SpO2 99%    Review of Systems denies hypoglycemia and fever    Objective:   Physical Exam VS: see vs page GEN: no distress HEAD: head: no deformity eyes: no periorbital swelling, no proptosis external nose and ears are normal mouth: no lesion seen NECK: supple, thyroid is not enlarged CHEST WALL: no deformity LUNGS: clear to auscultation BREASTS:  No gynecomastia CV: reg rate and rhythm, no murmur ABD: abdomen is soft, nontender.  no hepatosplenomegaly.  not distended.  no hernia. RECTAL: normal external and internal exam.  heme neg. PROSTATE:  Normal size.  No nodule MUSCULOSKELETAL: muscle bulk and strength are grossly normal.  no obvious joint swelling.  gait is normal and steady PULSES: no carotid bruit NEURO:  cn 2-12 grossly intact.   readily moves all 4's.   SKIN:  Normal texture and temperature.  No rash or suspicious lesion is visible.   NODES:  None palpable at the neck PSYCH: alert, oriented x3.  Does not appear anxious nor depressed.  Assessment & Plan:  Wellness visit today, with problems stable, except as noted.     SEPARATE EVALUATION FOLLOWS--EACH PROBLEM HERE IS NEW, NOT RESPONDING TO TREATMENT, OR POSES SIGNIFICANT RISK TO THE PATIENT'S HEALTH: HISTORY OF THE PRESENT ILLNESS: Pt returns for f/u of type1 DM (dx'ed 2011; no known complications).  no cbg record, but states cbg's are well-controlled.  He has lost weight, due to his efforts PAST MEDICAL HISTORY reviewed and up to date today REVIEW OF SYSTEMS: denies hypoglycemia PHYSICAL EXAMINATION: VITAL SIGNS:  See vs page GENERAL: no distress Pulses: dorsalis pedis intact bilat.   Feet: no deformity.  no ulcer on the feet.  feet are of normal color and temp.  no edema Neuro: sensation is intact to touch on the feet LAB/XRAY RESULTS: Lab Results  Component Value Date   HGBA1C 5.6 02/27/2012  IMPRESSION: DM, overcontrolled PLAN: See  instruction page

## 2012-02-28 LAB — HEMOGLOBIN A1C
Hgb A1c MFr Bld: 5.6 % (ref <5.7)
Mean Plasma Glucose: 114 mg/dL (ref <117)

## 2012-03-13 ENCOUNTER — Other Ambulatory Visit: Payer: Self-pay | Admitting: Endocrinology

## 2012-03-19 ENCOUNTER — Ambulatory Visit (INDEPENDENT_AMBULATORY_CARE_PROVIDER_SITE_OTHER): Payer: 59 | Admitting: Endocrinology

## 2012-03-19 ENCOUNTER — Encounter: Payer: Self-pay | Admitting: Endocrinology

## 2012-03-19 VITALS — BP 118/70 | HR 85 | Temp 99.2°F | Wt 162.0 lb

## 2012-03-19 DIAGNOSIS — E109 Type 1 diabetes mellitus without complications: Secondary | ICD-10-CM

## 2012-03-19 MED ORDER — DOXYCYCLINE HYCLATE 100 MG PO TABS: 100.0000 mg | ORAL_TABLET | Freq: Two times a day (BID) | ORAL | Status: DC

## 2012-03-19 NOTE — Progress Notes (Signed)
  Subjective:    Patient ID: Andrew Morrow, male    DOB: 1955/06/03, 56 y.o.   MRN: [id number]  HPI Pt states 1 day of moderate fever (101) and chills.  No pain at the ears.  No assoc sore throat.  Pt returns for f/u of type1 DM (dx'ed 2011; no known complications).  no cbg record, but states cbg's are well-controlled.   Past Medical History  Diagnosis Date  . HTN (hypertension)   . Hyperthyroidism     Past Surgical History  Procedure Date  . None reported     History   Social History  . Marital Status: Married    Spouse Name: N/A    Number of Children: N/A  . Years of Education: N/A   Occupational History  . Not on file.   Social History Main Topics  . Smoking status: Never Smoker   . Smokeless tobacco: Not on file  . Alcohol Use: Not on file  . Drug Use: Not on file  . Sexually Active: Not on file   Other Topics Concern  . Not on file   Social History Narrative   Works as Nurse, adult    Current Outpatient Prescriptions on File Prior to Visit  Medication Sig Dispense Refill  . glucose blood (ONE TOUCH ULTRA TEST) test strip 5/day, and ;lancets 5/day  250.03  200 each  12  . HUMALOG KWIKPEN 100 UNIT/ML injection INJECT 4 UNITS INTO THE SKIN 3 (THREE) TIMES DAILY BEFORE MEALS  15 Syringe  12  . insulin glargine (LANTUS SOLOSTAR) 100 UNIT/ML injection Inject 3 Units into the skin at bedtime.  15 mL  1  . insulin glargine (LANTUS) 100 UNIT/ML injection Inject 3 Units into the skin at bedtime. And pen needles, 1 daily      . insulin lispro (HUMALOG) 100 UNIT/ML injection Inject into the skin 3 (three) times daily before meals. 3 units with breakfast, 1 with lunch, and 4 with evening meal.  And pen needles 4/day      . Insulin Pen Needle (B-D ULTRAFINE III SHORT PEN) 31G X 8 MM MISC Use as directed four times daily  120 each  5  . lisinopril-hydrochlorothiazide (PRINZIDE,ZESTORETIC) 20-25 MG per tablet TAKE 1 TABLET BY MOUTH EVERY DAY  30 tablet  4  .  methimazole (TAPAZOLE) 10 MG tablet TAKE 1 TABLET EVERYDAY  120 tablet  1  . vardenafil (LEVITRA) 20 MG tablet Take 20 mg by mouth daily as needed.          No Known Allergies  Family History  Problem Relation Age of Onset  . Throat cancer Mother   . Other Son     Committed Suicide    BP 118/70  Pulse 85  Temp 99.2 F (37.3 C) (Oral)  Wt 162 lb (73.483 kg)  SpO2 98%   Review of Systems Denies cough, rash, dysuria, abd pain and sob.      Objective:   Physical Exam VITAL SIGNS:  See vs page GENERAL: no distress head: no deformity eyes: no periorbital swelling, no proptosis external nose and ears are normal mouth: no lesion seen Left TM is normal Right tm has fluid, but no erythema     Assessment & Plan:  DM, apparently well-controlled URI, new

## 2012-03-19 NOTE — Patient Instructions (Addendum)
i have sent a prescription to your pharmacy, for an antibiotic pill.  Loratadine-d (non-prescription) will help your congestion.   Please continue the same insulin.  Call if your blood sugar goes up or down during this illness.

## 2012-04-12 ENCOUNTER — Other Ambulatory Visit: Payer: Self-pay | Admitting: Endocrinology

## 2012-04-13 ENCOUNTER — Telehealth: Payer: Self-pay | Admitting: Endocrinology

## 2012-04-13 NOTE — Telephone Encounter (Signed)
i reviewed chart.  i don't see any needs

## 2012-04-13 NOTE — Telephone Encounter (Signed)
Patient called stating that he would like to know if he needs diabetic shoes or any other diabetic equipment or medications that the MD advises as he has a HSA and forgot to use it for the year and does not want to lose his money. Please advise and inform patient of advice.

## 2012-04-13 NOTE — Telephone Encounter (Signed)
Left message on machineper Dr. George Hugh message to to call if an questions

## 2012-05-31 ENCOUNTER — Other Ambulatory Visit: Payer: Self-pay

## 2012-05-31 MED ORDER — LISINOPRIL-HYDROCHLOROTHIAZIDE 20-25 MG PO TABS: 1.0000 | ORAL_TABLET | Freq: Every day | ORAL | Status: DC

## 2012-06-01 ENCOUNTER — Telehealth: Payer: Self-pay

## 2012-06-01 DIAGNOSIS — Z79899 Other long term (current) drug therapy: Secondary | ICD-10-CM

## 2012-06-01 DIAGNOSIS — Z125 Encounter for screening for malignant neoplasm of prostate: Secondary | ICD-10-CM

## 2012-06-01 DIAGNOSIS — K769 Liver disease, unspecified: Secondary | ICD-10-CM

## 2012-06-01 DIAGNOSIS — E0590 Thyrotoxicosis, unspecified without thyrotoxic crisis or storm: Secondary | ICD-10-CM

## 2012-06-01 DIAGNOSIS — E7800 Pure hypercholesterolemia, unspecified: Secondary | ICD-10-CM

## 2012-06-01 DIAGNOSIS — I10 Essential (primary) hypertension: Secondary | ICD-10-CM

## 2012-06-01 DIAGNOSIS — E109 Type 1 diabetes mellitus without complications: Secondary | ICD-10-CM

## 2012-06-01 HISTORY — DX: Other long term (current) drug therapy: Z79.899

## 2012-06-01 HISTORY — DX: Encounter for screening for malignant neoplasm of prostate: Z12.5

## 2012-06-01 NOTE — Telephone Encounter (Signed)
Pt has ov this Thursday and would like to come in for labs before the appt.  Please call when orders put in.

## 2012-06-01 NOTE — Telephone Encounter (Signed)
i ordered

## 2012-06-01 NOTE — Telephone Encounter (Signed)
Pt advised lab order placed in computer

## 2012-06-02 ENCOUNTER — Ambulatory Visit (INDEPENDENT_AMBULATORY_CARE_PROVIDER_SITE_OTHER): Payer: 59

## 2012-06-02 DIAGNOSIS — Z0000 Encounter for general adult medical examination without abnormal findings: Secondary | ICD-10-CM

## 2012-06-02 DIAGNOSIS — Z0389 Encounter for observation for other suspected diseases and conditions ruled out: Secondary | ICD-10-CM

## 2012-06-02 DIAGNOSIS — E291 Testicular hypofunction: Secondary | ICD-10-CM

## 2012-06-02 DIAGNOSIS — E109 Type 1 diabetes mellitus without complications: Secondary | ICD-10-CM

## 2012-06-02 LAB — TESTOSTERONE: Testosterone: 390.01 ng/dL (ref 350.00–890.00)

## 2012-06-02 LAB — URINALYSIS, ROUTINE W REFLEX MICROSCOPIC
Bilirubin Urine: NEGATIVE
Ketones, ur: NEGATIVE
Specific Gravity, Urine: 1.02 (ref 1.000–1.030)
Total Protein, Urine: NEGATIVE
Urine Glucose: NEGATIVE
pH: 7 (ref 5.0–8.0)

## 2012-06-02 LAB — LIPID PANEL
Cholesterol: 193 mg/dL (ref 0–200)
HDL: 37.7 mg/dL — ABNORMAL LOW (ref >39.00)
Total CHOL/HDL Ratio: 5
Triglycerides: 222 mg/dL — ABNORMAL HIGH (ref 0.0–149.0)
VLDL: 44.4 mg/dL — ABNORMAL HIGH (ref 0.0–40.0)

## 2012-06-02 LAB — HEPATIC FUNCTION PANEL
AST: 17 U/L (ref 0–37)
Albumin: 4.3 g/dL (ref 3.5–5.2)
Alkaline Phosphatase: 78 U/L (ref 39–117)
Bilirubin, Direct: 0.1 mg/dL (ref 0.0–0.3)

## 2012-06-02 LAB — BASIC METABOLIC PANEL
Calcium: 9.3 mg/dL (ref 8.4–10.5)
GFR: 92.88 mL/min (ref >60.00)
Glucose, Bld: 94 mg/dL (ref 70–99)
Potassium: 4.4 mEq/L (ref 3.5–5.1)
Sodium: 140 mEq/L (ref 135–145)

## 2012-06-02 LAB — CBC WITH DIFFERENTIAL/PLATELET
Basophils Absolute: 0 10*3/uL (ref 0.0–0.1)
Eosinophils Relative: 1.7 % (ref 0.0–5.0)
MCV: 88.8 fl (ref 78.0–100.0)
Monocytes Absolute: 0.5 10*3/uL (ref 0.1–1.0)
Monocytes Relative: 6.2 % (ref 3.0–12.0)
Neutrophils Relative %: 65.4 % (ref 43.0–77.0)
Platelets: 210 10*3/uL (ref 150.0–400.0)
RDW: 13.5 % (ref 11.5–14.6)
WBC: 8 10*3/uL (ref 4.5–10.5)

## 2012-06-02 LAB — TSH: TSH: 3.64 u[IU]/mL (ref 0.35–5.50)

## 2012-06-02 LAB — MICROALBUMIN / CREATININE URINE RATIO: Microalb Creat Ratio: 0.3 mg/g (ref 0.0–30.0)

## 2012-06-02 LAB — PSA: PSA: 0.97 ng/mL (ref 0.10–4.00)

## 2012-06-02 LAB — HEMOGLOBIN A1C: Hgb A1c MFr Bld: 5.8 % (ref 4.6–6.5)

## 2012-06-03 ENCOUNTER — Telehealth: Payer: Self-pay

## 2012-06-03 ENCOUNTER — Ambulatory Visit (INDEPENDENT_AMBULATORY_CARE_PROVIDER_SITE_OTHER): Payer: 59 | Admitting: Endocrinology

## 2012-06-03 VITALS — BP 122/74 | HR 80 | Wt 167.0 lb

## 2012-06-03 DIAGNOSIS — E109 Type 1 diabetes mellitus without complications: Secondary | ICD-10-CM

## 2012-06-03 LAB — LDL CHOLESTEROL, DIRECT: Direct LDL: 110.9 mg/dL

## 2012-06-03 MED ORDER — VARDENAFIL HCL 20 MG PO TABS: 20.0000 mg | ORAL_TABLET | Freq: Every day | ORAL | Status: DC | PRN

## 2012-06-03 NOTE — Progress Notes (Signed)
Subjective:    Patient ID: Andrew Morrow, male    DOB: [birth year], 56 y.o.   MRN: [id number]  HPI The state of at least three ongoing medical problems is addressed today, with interval history of each noted here: Pt returns for f/u of type1 DM (dx'ed 2011; no known complications).   he brings a record of his cbg's which i have reviewed today.  It varies from 70-200, but most are approx 100.  There is no trend throughout the day.  denies hypoglycemia.   Dyslipidemia: he denies chest pain Hyperthyroidism: he denies SOB Past Medical History  Diagnosis Date  . HTN (hypertension)   . Hyperthyroidism     Past Surgical History  Procedure Date  . None reported     History   Social History  . Marital Status: Married    Spouse Name: N/A    Number of Children: N/A  . Years of Education: N/A   Occupational History  . Not on file.   Social History Main Topics  . Smoking status: Never Smoker   . Smokeless tobacco: Not on file  . Alcohol Use: Not on file  . Drug Use: Not on file  . Sexually Active: Not on file   Other Topics Concern  . Not on file   Social History Narrative   Works as Nurse, adult    Current Outpatient Prescriptions on File Prior to Visit  Medication Sig Dispense Refill  . doxycycline (VIBRA-TABS) 100 MG tablet Take 1 tablet (100 mg total) by mouth 2 (two) times daily.  14 tablet  0  . HUMALOG KWIKPEN 100 UNIT/ML injection INJECT 4 UNITS INTO THE SKIN 3 (THREE) TIMES DAILY BEFORE MEALS  15 Syringe  12  . insulin glargine (LANTUS SOLOSTAR) 100 UNIT/ML injection Inject 3 Units into the skin at bedtime.  15 mL  1  . insulin glargine (LANTUS) 100 UNIT/ML injection Inject 2 Units into the skin at bedtime. And pen needles, 1 daily      . insulin lispro (HUMALOG) 100 UNIT/ML injection Inject into the skin 3 (three) times daily before meals. 3 units with breakfast, 1 with lunch, and 4 with evening meal.  And pen needles 4/day      . Insulin Pen Needle  (B-D ULTRAFINE III SHORT PEN) 31G X 8 MM MISC Use as directed four times daily  120 each  5  . lisinopril-hydrochlorothiazide (PRINZIDE,ZESTORETIC) 20-25 MG per tablet Take 1 tablet by mouth daily.  30 tablet  4  . methimazole (TAPAZOLE) 10 MG tablet TAKE 1 TABLET EVERYDAY  120 tablet  1  . ONETOUCH DELICA LANCETS MISC USE 4 TIMES A DAY  400 each  0  . vardenafil (LEVITRA) 20 MG tablet Take 1 tablet (20 mg total) by mouth daily as needed for erectile dysfunction.  20 tablet  5    No Known Allergies  Family History  Problem Relation Age of Onset  . Throat cancer Mother   . Other Son     Committed Suicide    BP 122/74  Pulse 80  Wt 167 lb (75.751 kg)  SpO2 99%  Review of Systems Denies weight change and fever.    Objective:   Physical Exam Pulses: dorsalis pedis intact bilat.   Feet: no deformity.  no ulcer on the feet.  feet are of normal color and temp.  no edema Neuro: sensation is intact to touch on the feet.    Lab Results  Component Value Date   WBC 8.0 06/02/2012  HGB 15.1 06/02/2012   HCT 44.7 06/02/2012   PLT 210.0 06/02/2012   GLUCOSE 94 06/02/2012   CHOL 193 06/02/2012   TRIG 222.0* 06/02/2012   HDL 37.70* 06/02/2012   LDLDIRECT 110.9 06/02/2012   LDLCALC 117* 02/13/2010   ALT 16 06/02/2012   AST 17 06/02/2012   NA 140 06/02/2012   K 4.4 06/02/2012   CL 103 06/02/2012   CREATININE 0.9 06/02/2012   BUN 16 06/02/2012   CO2 29 06/02/2012   TSH 3.64 06/02/2012   PSA 0.97 06/02/2012   HGBA1C 5.8 06/02/2012   MICROALBUR 0.6 06/02/2012      Assessment & Plan:  DM: overcontrolled Hyperthyroidism: well-controlled Dyslipidemia: needs increased rx

## 2012-06-03 NOTE — Telephone Encounter (Signed)
Pt states he did not get a rx for doxycyline at ov today, nor has he taken it but noticed this was on his med list?

## 2012-06-03 NOTE — Telephone Encounter (Signed)
It is an old med.  We'll take off med list, thanks

## 2012-06-03 NOTE — Patient Instructions (Addendum)
check your blood sugar 4 times a day: before the 3 meals, and at bedtime.  also check if you have symptoms of your blood sugar being too high or too low.  please keep a record of the readings and bring it to your next appointment here.  please call us sooner if your blood sugar goes below 70, or if you have a lot of readings over 200. Please come back for a follow-up appointment in 3 months.   blood tests are being requested for you today.  We'll contact you with results.

## 2012-07-05 ENCOUNTER — Other Ambulatory Visit: Payer: Self-pay | Admitting: *Deleted

## 2012-07-05 MED ORDER — BLOOD GLUCOSE TEST VI STRP: ORAL_STRIP | Status: DC

## 2012-07-06 ENCOUNTER — Other Ambulatory Visit: Payer: Self-pay

## 2012-07-23 ENCOUNTER — Telehealth: Payer: Self-pay | Admitting: Endocrinology

## 2012-07-23 NOTE — Telephone Encounter (Signed)
The patient called to state that he is unable to get his diabetic test strips due to not having a rx on file for low dose insulin.  The patient states that the insurance requires a prior authorization at [id number].  The patient requests that this be discussed with his wife Andrew Morrow at [phone] or with him if Andrew Morrow is not available at [phone].

## 2012-07-23 NOTE — Telephone Encounter (Signed)
Pt advised that we received a letter from [employer] approving his test strips one touch ultra blue

## 2012-07-29 NOTE — Telephone Encounter (Signed)
The patient's wife called to state that the insurance is stating they have not received prior authorization for the patient to receive 200 test strips per month and the fees for just 50 strips (what insurance is approving without the prior auth) is more than what they paid for the 200 test strips.  Please call the patient's wife to discuss at [phone].

## 2012-07-29 NOTE — Telephone Encounter (Signed)
The patient's wife called back to report that she spoke with [facility] and they told her that if our office called [phone] and spoke with a representative that this would be resolved today.  Please call the insurance company to resolve this for the patient.

## 2012-07-29 NOTE — Telephone Encounter (Signed)
Pt's son advised pt can only get 100 test strips at a time, prior auth for # 200 was denied.

## 2012-08-02 ENCOUNTER — Other Ambulatory Visit: Payer: Self-pay

## 2012-08-02 MED ORDER — METHIMAZOLE 10 MG PO TABS: ORAL_TABLET | ORAL | Status: DC

## 2012-09-01 ENCOUNTER — Ambulatory Visit (INDEPENDENT_AMBULATORY_CARE_PROVIDER_SITE_OTHER): Payer: 59 | Admitting: Endocrinology

## 2012-09-01 ENCOUNTER — Encounter: Payer: Self-pay | Admitting: Endocrinology

## 2012-09-01 VITALS — BP 122/70 | HR 70 | Wt 169.0 lb

## 2012-09-01 DIAGNOSIS — E109 Type 1 diabetes mellitus without complications: Secondary | ICD-10-CM

## 2012-09-01 LAB — HEMOGLOBIN A1C: Hgb A1c MFr Bld: 6.2 % (ref 4.6–6.5)

## 2012-09-01 NOTE — Patient Instructions (Addendum)
check your blood sugar 4 times a day: before the 3 meals, and at bedtime.  also check if you have symptoms of your blood sugar being too high or too low.  please keep a record of the readings and bring it to your next appointment here.  please call us sooner if your blood sugar goes below 70, or if you have a lot of readings over 200.   Please come back for a regular physical appointment in 3 months.   blood tests are being requested for you today.  We'll contact you with results.  Try wearing different shoes, to get rid of the callus on your foot.

## 2012-09-01 NOTE — Progress Notes (Signed)
  Subjective:    Patient ID: Andrew Morrow, male    DOB: [birth year], 57 y.o.   MRN: [id number]  HPI Pt returns for f/u of type1 DM (dx'ed 2011; no known complications; he has never had severe hypoglycemia or DKA).   no cbg record, but states cbg's are highest in the afternoon, and lowest before lunch.  denies hypoglycemia.   Pt states few years of intermittent slight nodule at the plantar aspect of the left foot, but no assoc pain.  Past Medical History  Diagnosis Date  . HTN (hypertension)   . Hyperthyroidism     Past Surgical History  Procedure Laterality Date  . None reported      History   Social History  . Marital Status: Married    Spouse Name: N/A    Number of Children: N/A  . Years of Education: N/A   Occupational History  . Not on file.   Social History Main Topics  . Smoking status: Never Smoker   . Smokeless tobacco: Not on file  . Alcohol Use: Not on file  . Drug Use: Not on file  . Sexually Active: Not on file   Other Topics Concern  . Not on file   Social History Narrative   Works as Radio broadcast assistant   Married    Current Outpatient Prescriptions on File Prior to Visit  Medication Sig Dispense Refill  . Glucose Blood (BLOOD GLUCOSE TEST STRIPS) STRP USE ONE TOUCH ULTRA TEST STRIPS; USE TO CHECK BLOOD SUGAR 4 X DAY; BEFORE EACH MEAL AND AT BEDTIME DIAGNOSIS CODE 250.03  200 each  3  . HUMALOG KWIKPEN 100 UNIT/ML injection INJECT 4 UNITS INTO THE SKIN 3 (THREE) TIMES DAILY BEFORE MEALS  15 Syringe  12  . insulin glargine (LANTUS SOLOSTAR) 100 UNIT/ML injection Inject 3 Units into the skin at bedtime.  15 mL  1  . Insulin Pen Needle (B-D ULTRAFINE III SHORT PEN) 31G X 8 MM MISC Use as directed four times daily  120 each  5  . lisinopril-hydrochlorothiazide (PRINZIDE,ZESTORETIC) 20-25 MG per tablet Take 1 tablet by mouth daily.  30 tablet  4  . methimazole (TAPAZOLE) 10 MG tablet TAKE 1 TABLET EVERYDAY  120 tablet  1  . ONETOUCH DELICA LANCETS MISC  USE 4 TIMES A DAY  400 each  0  . vardenafil (LEVITRA) 20 MG tablet Take 1 tablet (20 mg total) by mouth daily as needed for erectile dysfunction.  20 tablet  5  . insulin glargine (LANTUS) 100 UNIT/ML injection Inject 2 Units into the skin at bedtime. And pen needles, 1 daily      . insulin lispro (HUMALOG) 100 UNIT/ML injection Inject into the skin 3 (three) times daily before meals. 3 units with breakfast, 1 with lunch, and 4 with evening meal.  And pen needles 4/day       No current facility-administered medications on file prior to visit.    No Known Allergies  Family History  Problem Relation Age of Onset  . Throat cancer Mother   . Other Son     Committed Suicide    BP 122/70  Pulse 70  Wt 169 lb (76.658 kg)  BMI 26.46 kg/m2  SpO2 97%  Review of Systems Denies numbness and LOC.    Objective:   Physical Exam       Assessment & Plan:  DM: overcontrolled Callus, new

## 2012-11-08 ENCOUNTER — Other Ambulatory Visit: Payer: Self-pay | Admitting: Endocrinology

## 2012-12-01 ENCOUNTER — Encounter: Payer: Self-pay | Admitting: Endocrinology

## 2012-12-01 ENCOUNTER — Ambulatory Visit (INDEPENDENT_AMBULATORY_CARE_PROVIDER_SITE_OTHER): Payer: 59 | Admitting: Endocrinology

## 2012-12-01 VITALS — BP 100/60 | HR 73 | Temp 98.3°F | Wt 169.3 lb

## 2012-12-01 DIAGNOSIS — E109 Type 1 diabetes mellitus without complications: Secondary | ICD-10-CM

## 2012-12-01 DIAGNOSIS — Z23 Encounter for immunization: Secondary | ICD-10-CM

## 2012-12-01 DIAGNOSIS — E0590 Thyrotoxicosis, unspecified without thyrotoxic crisis or storm: Secondary | ICD-10-CM

## 2012-12-01 DIAGNOSIS — Z0000 Encounter for general adult medical examination without abnormal findings: Secondary | ICD-10-CM

## 2012-12-01 HISTORY — DX: Encounter for general adult medical examination without abnormal findings: Z00.00

## 2012-12-01 LAB — HEMOGLOBIN A1C: Hgb A1c MFr Bld: 6.1 % (ref 4.6–6.5)

## 2012-12-01 MED ORDER — SILDENAFIL CITRATE 20 MG PO TABS: ORAL_TABLET | ORAL | Status: DC

## 2012-12-01 MED ORDER — PNEUMOCOCCAL VAC POLYVALENT 25 MCG/0.5ML IJ INJ
0.5000 mL | INJECTION | Freq: Once | INTRAMUSCULAR | Status: AC
  Administered 2012-12-01: 0.5 mL via INTRAMUSCULAR

## 2012-12-01 NOTE — Progress Notes (Signed)
Subjective:    Patient ID: Andrew Morrow, male    DOB: [birth year], 56 y.o.   MRN: [id number]  HPI Pt is here for regular wellness examination, and is feeling pretty well in general, and says chronic med probs are stable, except as noted below Past Medical History  Diagnosis Date  . HTN (hypertension)   . Hyperthyroidism     Past Surgical History  Procedure Laterality Date  . None reported      History   Social History  . Marital Status: Married    Spouse Name: N/A    Number of Children: N/A  . Years of Education: N/A   Occupational History  . Not on file.   Social History Main Topics  . Smoking status: Never Smoker   . Smokeless tobacco: Not on file  . Alcohol Use: Not on file  . Drug Use: Not on file  . Sexually Active: Not on file   Other Topics Concern  . Not on file   Social History Narrative   Works as Radio broadcast assistant   Married    Current Outpatient Prescriptions on File Prior to Visit  Medication Sig Dispense Refill  . Glucose Blood (BLOOD GLUCOSE TEST STRIPS) STRP USE ONE TOUCH ULTRA TEST STRIPS; USE TO CHECK BLOOD SUGAR 4 X DAY; BEFORE EACH MEAL AND AT BEDTIME DIAGNOSIS CODE 250.03  200 each  3  . Insulin Pen Needle (B-D ULTRAFINE III SHORT PEN) 31G X 8 MM MISC Use as directed four times daily  120 each  5  . lisinopril-hydrochlorothiazide (PRINZIDE,ZESTORETIC) 20-25 MG per tablet TAKE 1 TABLET BY MOUTH DAILY.  30 tablet  4  . methimazole (TAPAZOLE) 10 MG tablet TAKE 1 TABLET EVERYDAY  120 tablet  1  . ONETOUCH DELICA LANCETS MISC USE 4 TIMES A DAY  400 each  0   No current facility-administered medications on file prior to visit.    No Known Allergies  Family History  Problem Relation Age of Onset  . Throat cancer Mother   . Other Son     Committed Suicide    BP 100/60  Pulse 73  Temp(Src) 98.3 F (36.8 C) (Oral)  Wt 169 lb 4.8 oz (76.794 kg)  BMI 26.51 kg/m2  SpO2 98%     Review of Systems  Constitutional: Negative for  fever and unexpected weight change.  HENT: Negative for hearing loss.   Eyes: Negative for visual disturbance.  Respiratory: Negative for shortness of breath.   Cardiovascular: Negative for chest pain.  Gastrointestinal: Negative for blood in stool.  Endocrine: Positive for cold intolerance.  Genitourinary: Negative for hematuria and difficulty urinating.  Musculoskeletal: Negative for back pain.  Skin: Negative for rash.  Allergic/Immunologic: Negative for environmental allergies.  Neurological: Negative for numbness.  Hematological: Does not bruise/bleed easily.  Psychiatric/Behavioral: Negative for dysphoric mood.       Objective:   Physical Exam VS: see vs page GEN: no distress HEAD: head: no deformity eyes: no periorbital swelling, no proptosis external nose and ears are normal mouth: no lesion seen NECK: supple, thyroid is not enlarged CHEST WALL: no deformity LUNGS: clear to auscultation BREASTS:  No gynecomastia CV: reg rate and rhythm, no murmur ABD: abdomen is soft, nontender.  no hepatosplenomegaly.  not distended.  no hernia GENITALIA:  Normal male.   RECTAL: normal external and internal exam.  heme neg. PROSTATE:  Normal size.  No nodule MUSCULOSKELETAL: muscle bulk and strength are grossly normal.  no obvious joint swelling.  gait is  normal and steady. PULSES: no carotid bruit NEURO:  cn 2-12 grossly intact.   readily moves all 4's.   SKIN:  Normal texture and temperature.  No rash or suspicious lesion is visible.   NODES:  None palpable at the neck PSYCH: alert, oriented x3.  Does not appear anxious nor depressed.      Assessment & Plan:  Wellness visit today, with problems stable, except as noted.     SEPARATE EVALUATION FOLLOWS--EACH PROBLEM HERE IS NEW, NOT RESPONDING TO TREATMENT, OR POSES SIGNIFICANT RISK TO THE PATIENT'S HEALTH: HISTORY OF THE PRESENT ILLNESS: Pt returns for f/u of type1 DM (dx'ed 2011; no known complications; he has never had  severe hypoglycemia or DKA).   Despite the decrease of his insulin at his last ov, he continues to have frequent hypoglycemia. PAST MEDICAL HISTORY reviewed and up to date today REVIEW OF SYSTEMS: Denies LOC. PHYSICAL EXAMINATION: VITAL SIGNS:  See vs page GENERAL: no distress LAB/XRAY RESULTS: Lab Results  Component Value Date   HGBA1C 6.1 12/01/2012  IMPRESSION: PLAN: See instruction page.

## 2012-12-01 NOTE — Patient Instructions (Addendum)
For now, please go off the insulin altogether. Please resume if the blood sugar goes over 200. Please come back for a follow-up appointment in 3 months please consider these measures for your health:  minimize alcohol.  do not use tobacco products.  have a colonoscopy at least every 10 years from age 57.  keep firearms safely stored.  always use seat belts.  have working smoke alarms in your home.  see an eye doctor and dentist regularly.  never drive under the influence of alcohol or drugs (including prescription drugs).  those with fair skin should take precautions against the sun.

## 2012-12-03 ENCOUNTER — Other Ambulatory Visit: Payer: Self-pay | Admitting: *Deleted

## 2012-12-03 MED ORDER — SILDENAFIL CITRATE 20 MG PO TABS: 20.0000 mg | ORAL_TABLET | Freq: Every day | ORAL | Status: DC

## 2013-03-08 ENCOUNTER — Encounter: Payer: Self-pay | Admitting: Endocrinology

## 2013-03-08 ENCOUNTER — Ambulatory Visit (INDEPENDENT_AMBULATORY_CARE_PROVIDER_SITE_OTHER): Payer: 59 | Admitting: Endocrinology

## 2013-03-08 VITALS — BP 118/68 | Temp 98.5°F | Wt 168.0 lb

## 2013-03-08 DIAGNOSIS — E109 Type 1 diabetes mellitus without complications: Secondary | ICD-10-CM

## 2013-03-08 DIAGNOSIS — Z23 Encounter for immunization: Secondary | ICD-10-CM

## 2013-03-08 LAB — HEMOGLOBIN A1C
Hgb A1c MFr Bld: 6.1 % — ABNORMAL HIGH (ref <5.7)
Mean Plasma Glucose: 128 mg/dL — ABNORMAL HIGH (ref <117)

## 2013-03-08 MED ORDER — SILDENAFIL CITRATE 20 MG PO TABS: ORAL_TABLET | ORAL | Status: DC

## 2013-03-08 NOTE — Progress Notes (Signed)
  Subjective:    Patient ID: Andrew Morrow, male    DOB: [birth year], 56 y.o.   MRN: [id number]  HPI Pt returns for f/u of type1 DM (dx'ed 2011; he has mild if any neuropathy of the lower extremities; no known associated complications; he has never had severe hypoglycemia or DKA).   In July of 2014, he went off insulin altogether.  he brings a record of his cbg's which i have reviewed today.  It varies from 100-200.   Past Medical History  Diagnosis Date  . HTN (hypertension)   . Hyperthyroidism     Past Surgical History  Procedure Laterality Date  . None reported      History   Social History  . Marital Status: Married    Spouse Name: N/A    Number of Children: N/A  . Years of Education: N/A   Occupational History  . Not on file.   Social History Main Topics  . Smoking status: Never Smoker   . Smokeless tobacco: Not on file  . Alcohol Use: Not on file  . Drug Use: Not on file  . Sexual Activity: Not on file   Other Topics Concern  . Not on file   Social History Narrative   Works as Radio broadcast assistant   Married    Current Outpatient Prescriptions on File Prior to Visit  Medication Sig Dispense Refill  . Glucose Blood (BLOOD GLUCOSE TEST STRIPS) STRP USE ONE TOUCH ULTRA TEST STRIPS; USE TO CHECK BLOOD SUGAR 4 X DAY; BEFORE EACH MEAL AND AT BEDTIME DIAGNOSIS CODE 250.03  200 each  3  . Insulin Pen Needle (B-D ULTRAFINE III SHORT PEN) 31G X 8 MM MISC Use as directed four times daily  120 each  5  . lisinopril-hydrochlorothiazide (PRINZIDE,ZESTORETIC) 20-25 MG per tablet TAKE 1 TABLET BY MOUTH DAILY.  30 tablet  4  . methimazole (TAPAZOLE) 10 MG tablet TAKE 1 TABLET EVERYDAY  120 tablet  1  . ONETOUCH DELICA LANCETS MISC USE 4 TIMES A DAY  400 each  0  . sildenafil (REVATIO) 20 MG tablet Take 1 tablet (20 mg total) by mouth daily.  6 tablet  11   No current facility-administered medications on file prior to visit.    No Known Allergies  Family History  Problem  Relation Age of Onset  . Throat cancer Mother   . Other Son     Committed Suicide    BP 118/68  Temp(Src) 98.5 F (36.9 C) (Oral)  Wt 168 lb (76.204 kg)  BMI 26.31 kg/m2  Review of Systems denies hypoglycemia and weight change.    Objective:   Physical Exam VITAL SIGNS:  See vs page GENERAL: no distress SKIN:  Insulin injection sites at the anterior abdomen are normal    Lab Results  Component Value Date   HGBA1C 6.1* 03/08/2013      Assessment & Plan:  DM: he is likely in "honeymoon" phase

## 2013-03-08 NOTE — Patient Instructions (Addendum)
check your blood sugar once a day.  vary the time of day when you check, between before the 3 meals, and at bedtime.  also check if you have symptoms of your blood sugar being too high or too low.  please keep a record of the readings and bring it to your next appointment here.  You can write it on any piece of paper.  please call us sooner if your blood sugar goes below 70, or if you have a lot of readings over 200. Please come back for a follow-up appointment in 3 months.   blood tests are being requested for you today.  We'll contact you with results.   i have sent a prescription to [facility], for the generic viagra.

## 2013-03-09 NOTE — Progress Notes (Signed)
Quick Note:  Left a message for pt to return call. ______ 

## 2013-03-09 NOTE — Progress Notes (Signed)
Quick Note:  Called and spoke with pt and pt is aware. ______ 

## 2013-03-09 NOTE — Progress Notes (Signed)
Quick Note:  Left a message for return call. ______ 

## 2013-06-08 ENCOUNTER — Encounter: Payer: Self-pay | Admitting: Endocrinology

## 2013-06-08 ENCOUNTER — Ambulatory Visit (INDEPENDENT_AMBULATORY_CARE_PROVIDER_SITE_OTHER): Payer: 59 | Admitting: Endocrinology

## 2013-06-08 VITALS — BP 126/76 | HR 68 | Temp 98.3°F | Ht 67.0 in | Wt 161.0 lb

## 2013-06-08 DIAGNOSIS — E0590 Thyrotoxicosis, unspecified without thyrotoxic crisis or storm: Secondary | ICD-10-CM

## 2013-06-08 DIAGNOSIS — E109 Type 1 diabetes mellitus without complications: Secondary | ICD-10-CM

## 2013-06-08 LAB — TSH: TSH: 1.72 u[IU]/mL (ref 0.35–5.50)

## 2013-06-08 LAB — HEMOGLOBIN A1C: HEMOGLOBIN A1C: 6.1 % (ref 4.6–6.5)

## 2013-06-08 MED ORDER — TADALAFIL 5 MG PO TABS: 5.0000 mg | ORAL_TABLET | Freq: Every day | ORAL | Status: DC

## 2013-06-08 NOTE — Progress Notes (Signed)
   Subjective:    Patient ID: Andrew Morrow, male    DOB: 10/13/1955, 58 y.o.   MRN: [id number]  HPI Pt returns for f/u of type1 DM (dx'ed 2011, on a routine blood test; he has mild if any neuropathy of the lower extremities; no known associated complications; he was started on insulin since soon after dx; he has never had severe hypoglycemia or DKA; in July of 2014, he went off insulin altogether). he brings a record of his cbg's which i have reviewed today.  He checks at the evening meal only.  It varies from 84-226.  pt states he feels well in general.   Pt returns for f/u of hyperthyroidism (dx'ed 2000; he chose thionamide rx; he was off rx from 2005-2007, but it recurred, so he has been on tapazole since then; he has never had thyroid imaging) Past Medical History  Diagnosis Date  . HTN (hypertension)   . Hyperthyroidism     Past Surgical History  Procedure Laterality Date  . None reported      History   Social History  . Marital Status: Married    Spouse Name: N/A    Number of Children: N/A  . Years of Education: N/A   Occupational History  . Not on file.   Social History Main Topics  . Smoking status: Never Smoker   . Smokeless tobacco: Not on file  . Alcohol Use: Not on file  . Drug Use: Not on file  . Sexual Activity: Not on file   Other Topics Concern  . Not on file   Social History Narrative   Works as Chief Financial Officer   Married    Current Outpatient Prescriptions on File Prior to Visit  Medication Sig Dispense Refill  . Glucose Blood (BLOOD GLUCOSE TEST STRIPS) STRP USE ONE TOUCH ULTRA TEST STRIPS; USE TO CHECK BLOOD SUGAR 4 X DAY; BEFORE EACH MEAL AND AT BEDTIME DIAGNOSIS CODE 250.03  200 each  3  . Insulin Pen Needle (B-D ULTRAFINE III SHORT PEN) 31G X 8 MM MISC Use as directed four times daily  120 each  5  . lisinopril-hydrochlorothiazide (PRINZIDE,ZESTORETIC) 20-25 MG per tablet TAKE 1 TABLET BY MOUTH DAILY.  30 tablet  4  . methimazole  (TAPAZOLE) 10 MG tablet TAKE 1 TABLET EVERYDAY  120 tablet  1  . ONETOUCH DELICA LANCETS MISC USE 4 TIMES A DAY  400 each  0   No current facility-administered medications on file prior to visit.    No Known Allergies  Family History  Problem Relation Age of Onset  . Throat cancer Mother   . Other Son     Committed Suicide    BP 126/76  Pulse 68  Temp(Src) 98.3 F (36.8 C) (Oral)  Ht 5\' 7"  (1.702 m)  Wt 161 lb (73.029 kg)  BMI 25.21 kg/m2  SpO2 98%  Review of Systems denies hypoglycemia and weight change.      Objective:   Physical Exam VITAL SIGNS:  See vs page GENERAL: no distress  Lab Results  Component Value Date   HGBA1C 6.1 06/08/2013   Lab Results  Component Value Date   TSH 1.72 06/08/2013      Assessment & Plan:  Hyperthyroidism: well-controlled Type 1 DM: now in "honeymoon" phase. HTN: well-controlled

## 2013-06-08 NOTE — Patient Instructions (Signed)
check your blood sugar once a day.  vary the time of day when you check, between before the 3 meals, and at bedtime.  also check if you have symptoms of your blood sugar being too high or too low.  please keep a record of the readings and bring it to your next appointment here.  You can write it on any piece of paper.  please call us sooner if your blood sugar goes below 70, or if you have a lot of readings over 200. Please come back for a follow-up appointment in 3 months.   blood tests are being requested for you today.  We'll contact you with results.

## 2013-06-18 ENCOUNTER — Other Ambulatory Visit: Payer: Self-pay | Admitting: Endocrinology

## 2013-11-14 ENCOUNTER — Other Ambulatory Visit: Payer: Self-pay

## 2013-11-14 MED ORDER — GLUCOSE BLOOD VI STRP: ORAL_STRIP | Status: DC

## 2013-11-29 ENCOUNTER — Telehealth: Payer: Self-pay

## 2013-11-29 NOTE — Telephone Encounter (Signed)
Diabetic Bundle pt  Follow up appt made for 01/26/14 at 7:45am.

## 2014-01-12 ENCOUNTER — Other Ambulatory Visit: Payer: Self-pay | Admitting: Endocrinology

## 2014-01-26 ENCOUNTER — Encounter: Payer: Self-pay | Admitting: Endocrinology

## 2014-01-26 ENCOUNTER — Ambulatory Visit (INDEPENDENT_AMBULATORY_CARE_PROVIDER_SITE_OTHER): Payer: 59 | Admitting: Endocrinology

## 2014-01-26 VITALS — BP 106/64 | HR 77 | Temp 98.2°F | Ht 67.0 in | Wt 159.0 lb

## 2014-01-26 DIAGNOSIS — E0590 Thyrotoxicosis, unspecified without thyrotoxic crisis or storm: Secondary | ICD-10-CM

## 2014-01-26 DIAGNOSIS — Z23 Encounter for immunization: Secondary | ICD-10-CM

## 2014-01-26 DIAGNOSIS — E291 Testicular hypofunction: Secondary | ICD-10-CM

## 2014-01-26 DIAGNOSIS — Z0000 Encounter for general adult medical examination without abnormal findings: Secondary | ICD-10-CM

## 2014-01-26 DIAGNOSIS — E7800 Pure hypercholesterolemia, unspecified: Secondary | ICD-10-CM

## 2014-01-26 DIAGNOSIS — Z79899 Other long term (current) drug therapy: Secondary | ICD-10-CM

## 2014-01-26 DIAGNOSIS — Z125 Encounter for screening for malignant neoplasm of prostate: Secondary | ICD-10-CM

## 2014-01-26 DIAGNOSIS — K769 Liver disease, unspecified: Secondary | ICD-10-CM

## 2014-01-26 DIAGNOSIS — E109 Type 1 diabetes mellitus without complications: Secondary | ICD-10-CM

## 2014-01-26 DIAGNOSIS — I10 Essential (primary) hypertension: Secondary | ICD-10-CM

## 2014-01-26 LAB — BASIC METABOLIC PANEL
BUN: 17 mg/dL (ref 6–23)
CO2: 30 mEq/L (ref 19–32)
Calcium: 9.6 mg/dL (ref 8.4–10.5)
Chloride: 100 mEq/L (ref 96–112)
Creatinine, Ser: 1.4 mg/dL (ref 0.4–1.5)
GFR: 57.83 mL/min — AB (ref >60.00)
GLUCOSE: 122 mg/dL — AB (ref 70–99)
POTASSIUM: 4 meq/L (ref 3.5–5.1)
SODIUM: 139 meq/L (ref 135–145)

## 2014-01-26 LAB — HEPATIC FUNCTION PANEL
ALK PHOS: 78 U/L (ref 39–117)
ALT: 16 U/L (ref 0–53)
AST: 20 U/L (ref 0–37)
Albumin: 4.5 g/dL (ref 3.5–5.2)
BILIRUBIN TOTAL: 0.5 mg/dL (ref 0.2–1.2)
Bilirubin, Direct: 0.1 mg/dL (ref 0.0–0.3)
Total Protein: 7.6 g/dL (ref 6.0–8.3)

## 2014-01-26 LAB — LIPID PANEL
CHOLESTEROL: 196 mg/dL (ref 0–200)
HDL: 39 mg/dL — ABNORMAL LOW (ref >39.00)
LDL Cholesterol: 120 mg/dL — ABNORMAL HIGH (ref 0–99)
NonHDL: 157
TRIGLYCERIDES: 183 mg/dL — AB (ref 0.0–149.0)
Total CHOL/HDL Ratio: 5
VLDL: 36.6 mg/dL (ref 0.0–40.0)

## 2014-01-26 LAB — MICROALBUMIN / CREATININE URINE RATIO
CREATININE, U: 338.5 mg/dL
Microalb Creat Ratio: 0.3 mg/g (ref 0.0–30.0)
Microalb, Ur: 1 mg/dL (ref 0.0–1.9)

## 2014-01-26 LAB — URINALYSIS, ROUTINE W REFLEX MICROSCOPIC
Bilirubin Urine: NEGATIVE
Leukocytes, UA: NEGATIVE
Nitrite: NEGATIVE
Specific Gravity, Urine: 1.025 (ref 1.000–1.030)
Total Protein, Urine: NEGATIVE
URINE GLUCOSE: NEGATIVE
Urobilinogen, UA: 0.2 (ref 0.0–1.0)
pH: 5.5 (ref 5.0–8.0)

## 2014-01-26 LAB — CBC WITH DIFFERENTIAL/PLATELET
BASOS PCT: 0.3 % (ref 0.0–3.0)
Basophils Absolute: 0 10*3/uL (ref 0.0–0.1)
EOS PCT: 1.3 % (ref 0.0–5.0)
Eosinophils Absolute: 0.1 10*3/uL (ref 0.0–0.7)
HEMATOCRIT: 46 % (ref 39.0–52.0)
HEMOGLOBIN: 15.4 g/dL (ref 13.0–17.0)
Lymphocytes Relative: 18.9 % (ref 12.0–46.0)
Lymphs Abs: 1.5 10*3/uL (ref 0.7–4.0)
MCHC: 33.4 g/dL (ref 30.0–36.0)
MCV: 90.1 fl (ref 78.0–100.0)
Monocytes Absolute: 0.4 10*3/uL (ref 0.1–1.0)
Monocytes Relative: 5.2 % (ref 3.0–12.0)
NEUTROS ABS: 6 10*3/uL (ref 1.4–7.7)
Neutrophils Relative %: 74.3 % (ref 43.0–77.0)
Platelets: 217 10*3/uL (ref 150.0–400.0)
RBC: 5.11 Mil/uL (ref 4.22–5.81)
RDW: 13.2 % (ref 11.5–15.5)
WBC: 8.1 10*3/uL (ref 4.0–10.5)

## 2014-01-26 LAB — PSA: PSA: 1.11 ng/mL (ref 0.10–4.00)

## 2014-01-26 LAB — TSH: TSH: 1.88 u[IU]/mL (ref 0.35–4.50)

## 2014-01-26 LAB — HEMOGLOBIN A1C: HEMOGLOBIN A1C: 5.9 % (ref 4.6–6.5)

## 2014-01-26 NOTE — Progress Notes (Signed)
Subjective:    Patient ID: Andrew Morrow, male    DOB: 28-Oct-1955, 58 y.o.   MRN: [id number]  HPI Pt is here for regular wellness examination, and is feeling pretty well in general, and says chronic med probs are stable, except as noted below Past Medical History  Diagnosis Date  . HTN (hypertension)   . Hyperthyroidism     Past Surgical History  Procedure Laterality Date  . None reported      History   Social History  . Marital Status: Married    Spouse Name: N/A    Number of Children: N/A  . Years of Education: N/A   Occupational History  . Not on file.   Social History Main Topics  . Smoking status: Never Smoker   . Smokeless tobacco: Not on file  . Alcohol Use: Not on file  . Drug Use: Not on file  . Sexual Activity: Not on file   Other Topics Concern  . Not on file   Social History Narrative   Works as Chief Financial Officer   Married    Current Outpatient Prescriptions on File Prior to Visit  Medication Sig Dispense Refill  . Glucose Blood (BLOOD GLUCOSE TEST STRIPS) STRP USE ONE TOUCH ULTRA TEST STRIPS; USE TO CHECK BLOOD SUGAR 4 X DAY; BEFORE EACH MEAL AND AT BEDTIME DIAGNOSIS CODE 250.03  200 each  3  . glucose blood (ONE TOUCH TEST STRIPS) test strip Use 4 striper per day. Dx code: 250.03  120 each  1  . Insulin Pen Needle (B-D ULTRAFINE III SHORT PEN) 31G X 8 MM MISC Use as directed four times daily  120 each  5  . lisinopril-hydrochlorothiazide (PRINZIDE,ZESTORETIC) 20-25 MG per tablet TAKE 1 TABLET BY MOUTH DAILY.  30 tablet  4  . methimazole (TAPAZOLE) 10 MG tablet TAKE 1 TABLET EVERYDAY  120 tablet  1  . ONETOUCH DELICA LANCETS MISC USE 4 TIMES A DAY  400 each  0  . tadalafil (CIALIS) 5 MG tablet Take 1 tablet (5 mg total) by mouth daily.  30 tablet  0   No current facility-administered medications on file prior to visit.    No Known Allergies  Family History  Problem Relation Age of Onset  . Throat cancer Mother   . Other Son    Committed Suicide    BP 106/64  Pulse 77  Temp(Src) 98.2 F (36.8 C) (Oral)  Ht 5\' 7"  (1.702 m)  Wt 159 lb (72.122 kg)  BMI 24.90 kg/m2  SpO2 97%   Review of Systems  Constitutional: Negative for fever and unexpected weight change.  HENT: Negative for hearing loss.   Eyes: Negative for visual disturbance.  Respiratory: Negative for shortness of breath.   Cardiovascular: Negative for chest pain.  Gastrointestinal: Negative for anal bleeding.  Endocrine: Negative for cold intolerance.  Genitourinary: Negative for hematuria.  Musculoskeletal: Negative for back pain.  Skin: Negative for rash.  Allergic/Immunologic: Negative for environmental allergies.  Neurological: Negative for syncope.  Hematological: Does not bruise/bleed easily.  Psychiatric/Behavioral: Negative for dysphoric mood.       Objective:   Physical Exam VS: see vs page GEN: no distress HEAD: head: no deformity eyes: no periorbital swelling, no proptosis external nose and ears are normal mouth: no lesion seen NECK: supple, thyroid is not enlarged CHEST WALL: no deformity LUNGS: clear to auscultation BREASTS:  No gynecomastia CV: reg rate and rhythm, no murmur ABD: abdomen is soft, nontender.  no hepatosplenomegaly.  not distended.  no hernia.   RECTAL: normal external and internal exam.  heme neg. PROSTATE:  Normal size.  No nodule MUSCULOSKELETAL: muscle bulk and strength are grossly normal.  no obvious joint swelling.  gait is normal and steady EXTEMITIES: no deformity.  no ulcer on the feet.  feet are of normal color and temp.  no edema PULSES: dorsalis pedis intact bilat.  no carotid bruit NEURO:  cn 2-12 grossly intact.   readily moves all 4's.  sensation is intact to touch on the feet SKIN:  Normal texture and temperature.  No rash or suspicious lesion is visible.   NODES:  None palpable at the neck PSYCH: alert, well-oriented.  Does not appear anxious nor depressed.  i reviewed  electrocardiogram     Assessment & Plan:  Wellness visit today, with problems stable, except as noted. Patient is advised the following: Patient Instructions  please consider these measures for your health:  minimize alcohol.  do not use tobacco products.  have a colonoscopy at least every 10 years from age 89.  keep firearms safely stored.  always use seat belts.  have working smoke alarms in your home.  see an eye doctor and dentist regularly.  never drive under the influence of alcohol or drugs (including prescription drugs).  those with fair skin should take precautions against the sun. Please come back for a follow-up appointment in 6 months

## 2014-01-26 NOTE — Patient Instructions (Addendum)
please consider these measures for your health:  minimize alcohol.  do not use tobacco products.  have a colonoscopy at least every 10 years from age 58.  keep firearms safely stored.  always use seat belts.  have working smoke alarms in your home.  see an eye doctor and dentist regularly.  never drive under the influence of alcohol or drugs (including prescription drugs).  those with fair skin should take precautions against the sun. Please come back for a follow-up appointment in 6 months

## 2014-02-08 ENCOUNTER — Other Ambulatory Visit: Payer: Self-pay | Admitting: Endocrinology

## 2014-02-27 ENCOUNTER — Encounter: Payer: Self-pay | Admitting: Endocrinology

## 2014-05-29 ENCOUNTER — Other Ambulatory Visit: Payer: Self-pay | Admitting: Endocrinology

## 2014-07-27 ENCOUNTER — Encounter: Payer: Self-pay | Admitting: Endocrinology

## 2014-07-27 ENCOUNTER — Ambulatory Visit (INDEPENDENT_AMBULATORY_CARE_PROVIDER_SITE_OTHER): Payer: 59 | Admitting: Endocrinology

## 2014-07-27 VITALS — BP 118/80 | HR 72 | Temp 98.3°F | Ht 67.0 in | Wt 166.0 lb

## 2014-07-27 DIAGNOSIS — E0580 Other thyrotoxicosis without thyrotoxic crisis or storm: Secondary | ICD-10-CM

## 2014-07-27 DIAGNOSIS — R319 Hematuria, unspecified: Secondary | ICD-10-CM | POA: Insufficient documentation

## 2014-07-27 DIAGNOSIS — Z129 Encounter for screening for malignant neoplasm, site unspecified: Secondary | ICD-10-CM

## 2014-07-27 DIAGNOSIS — E119 Type 2 diabetes mellitus without complications: Secondary | ICD-10-CM

## 2014-07-27 HISTORY — DX: Hematuria, unspecified: R31.9

## 2014-07-27 HISTORY — DX: Encounter for screening for malignant neoplasm, site unspecified: Z12.9

## 2014-07-27 LAB — TSH: TSH: 3.08 u[IU]/mL (ref 0.35–4.50)

## 2014-07-27 LAB — URINALYSIS, ROUTINE W REFLEX MICROSCOPIC
Bilirubin Urine: NEGATIVE
KETONES UR: NEGATIVE
Leukocytes, UA: NEGATIVE
Nitrite: NEGATIVE
PH: 6 (ref 5.0–8.0)
Specific Gravity, Urine: 1.025 (ref 1.000–1.030)
Total Protein, Urine: NEGATIVE
UROBILINOGEN UA: 0.2 (ref 0.0–1.0)
Urine Glucose: NEGATIVE
WBC, UA: NONE SEEN (ref 0–?)

## 2014-07-27 LAB — HEMOGLOBIN A1C: HEMOGLOBIN A1C: 6.3 % (ref 4.6–6.5)

## 2014-07-27 NOTE — Patient Instructions (Signed)
check your blood sugar once a day.  vary the time of day when you check, between before the 3 meals, and at bedtime.  also check if you have symptoms of your blood sugar being too high or too low.  please keep a record of the readings and bring it to your next appointment here.  You can write it on any piece of paper.  please call us sooner if your blood sugar goes below 70, or if you have a lot of readings over 150. blood and urine tests are being requested for you today.  We'll let you know about the results.  If there is any blood in the urine, please see a specialist.  Please come back for a regular physical appointment in 6 months (must be after 01/27/15). With time, your blood sugar will go back up again.

## 2014-07-27 NOTE — Progress Notes (Signed)
Subjective:    Patient ID: Andrew Morrow, male    DOB: 1956/02/21, 59 y.o.   MRN: [id number]  HPI  The state of at least three ongoing medical problems is addressed today, with interval history of each noted here: Pt returns for f/u of diabetes mellitus: DM type: 1 Dx'ed: 7824 Complications: polyneuropathy  Therapy: insulin 2011-2014; no med since then DKA: never Severe hypoglycemia: never Pancreatitis: never Other: he has not recently required medication Interval history: no cbg record, but states cbg's are well-controlled.  He denies hypoglycemia Hematuria: he does not notice Pt also returns for f/u of hyperthyroidism (dx'ed 2000; he chose thionamide rx; he was off rx from 2005-2007, but it recurred, so he has been on tapazole since then; he has never had thyroid imaging).  he denies weight change Past Medical History  Diagnosis Date  . HTN (hypertension)   . Hyperthyroidism     Past Surgical History  Procedure Laterality Date  . None reported      History   Social History  . Marital Status: Married    Spouse Name: N/A  . Number of Children: N/A  . Years of Education: N/A   Occupational History  . Not on file.   Social History Main Topics  . Smoking status: Never Smoker   . Smokeless tobacco: Not on file  . Alcohol Use: Not on file  . Drug Use: Not on file  . Sexual Activity: Not on file   Other Topics Concern  . Not on file   Social History Narrative   Works as Chief Financial Officer   Married    Current Outpatient Prescriptions on File Prior to Visit  Medication Sig Dispense Refill  . lisinopril-hydrochlorothiazide (PRINZIDE,ZESTORETIC) 20-25 MG per tablet TAKE 1 TABLET BY MOUTH DAILY. 30 tablet 4  . methimazole (TAPAZOLE) 10 MG tablet TAKE 1 TABLET EVERYDAY 120 tablet 1  . tadalafil (CIALIS) 5 MG tablet Take 1 tablet (5 mg total) by mouth daily. 30 tablet 0   No current facility-administered medications on file prior to visit.    No Known  Allergies  Family History  Problem Relation Age of Onset  . Throat cancer Mother   . Other Son     Committed Suicide    BP 118/80 mmHg  Pulse 72  Temp(Src) 98.3 F (36.8 C) (Oral)  Ht 5\' 7"  (1.702 m)  Wt 166 lb (75.297 kg)  BMI 25.99 kg/m2  SpO2 96%    Review of Systems Denies fever, chest pain, and sob    Objective:   Physical Exam VITAL SIGNS:  See vs page GENERAL: no distress Pulses: dorsalis pedis intact bilat.   MSK: no deformity of the feet CV: no leg edema Skin:  no ulcer on the feet.  normal color and temp on the feet. Neuro: sensation is intact to touch on the feet  Lab Results  Component Value Date   HGBA1C 6.3 07/27/2014   Lab Results  Component Value Date   TSH 3.08 07/27/2014   UA: pos for WBC    Assessment & Plan:  Hyperthyroidism: well-controlled DM: worse, so he needs to check cbg's Hematuria: persistent  Patient is advised the following: Patient Instructions  check your blood sugar once a day.  vary the time of day when you check, between before the 3 meals, and at bedtime.  also check if you have symptoms of your blood sugar being too high or too low.  please keep a record of the readings and bring it  to your next appointment here.  You can write it on any piece of paper.  please call us sooner if your blood sugar goes below 70, or if you have a lot of readings over 150. blood and urine tests are being requested for you today.  We'll let you know about the results.  If there is any blood in the urine, please see a specialist.  Please come back for a regular physical appointment in 6 months (must be after 01/27/15). With time, your blood sugar will go back up again.    addendum: ref [facility]

## 2014-08-22 ENCOUNTER — Encounter: Payer: Self-pay | Admitting: Internal Medicine

## 2014-08-23 LAB — HM DIABETES EYE EXAM

## 2014-08-24 ENCOUNTER — Encounter: Payer: Self-pay | Admitting: Endocrinology

## 2014-08-29 ENCOUNTER — Encounter: Payer: Self-pay | Admitting: Endocrinology

## 2014-09-03 ENCOUNTER — Other Ambulatory Visit: Payer: Self-pay | Admitting: Endocrinology

## 2014-10-05 ENCOUNTER — Ambulatory Visit (AMBULATORY_SURGERY_CENTER): Payer: Self-pay | Admitting: *Deleted

## 2014-10-05 VITALS — Ht 67.0 in | Wt 171.0 lb

## 2014-10-05 DIAGNOSIS — Z1211 Encounter for screening for malignant neoplasm of colon: Secondary | ICD-10-CM

## 2014-10-05 MED ORDER — NA SULFATE-K SULFATE-MG SULF 17.5-3.13-1.6 GM/177ML PO SOLN: 1.0000 | Freq: Once | ORAL | Status: DC

## 2014-10-05 NOTE — Progress Notes (Signed)
No egg or soy allergy. No anesthesia problems.  No home 02.  No diet meds.

## 2014-10-19 ENCOUNTER — Ambulatory Visit (AMBULATORY_SURGERY_CENTER): Payer: 59 | Admitting: Internal Medicine

## 2014-10-19 ENCOUNTER — Encounter: Payer: Self-pay | Admitting: Internal Medicine

## 2014-10-19 VITALS — BP 105/86 | HR 64 | Temp 98.4°F | Resp 16 | Ht 67.0 in | Wt 171.0 lb

## 2014-10-19 DIAGNOSIS — D122 Benign neoplasm of ascending colon: Secondary | ICD-10-CM

## 2014-10-19 DIAGNOSIS — Z1211 Encounter for screening for malignant neoplasm of colon: Secondary | ICD-10-CM | POA: Diagnosis present

## 2014-10-19 DIAGNOSIS — D123 Benign neoplasm of transverse colon: Secondary | ICD-10-CM

## 2014-10-19 MED ORDER — SODIUM CHLORIDE 0.9 % IV SOLN: 500.0000 mL | INTRAVENOUS | Status: DC

## 2014-10-19 NOTE — Progress Notes (Signed)
Patient stating his blood sugar at home this am was 63, asymptomatic. Patient stating his normal blood sugars run 90 - 120. d5w started.

## 2014-10-19 NOTE — Op Note (Signed)
[facility]  [employer]. [place], [id number]   COLONOSCOPY PROCEDURE REPORT  PATIENT: Andrew Morrow, Andrew Morrow  MR#: [id number] BIRTHDATE: 15-Sep-1955 , 16  yrs. old GENDER: male ENDOSCOPIST: Jerene Bears, MD REFERRED RC:BULA Rupert Stacks, M.D. PROCEDURE DATE:  10/19/2014 PROCEDURE:   Colonoscopy, screening, Colonoscopy with snare polypectomy, and Colonoscopy with cold biopsy polypectomy First Screening Colonoscopy - Avg.  risk and is 50 yrs.  old or older Yes.  Prior Negative Screening - Now for repeat screening. N/A  History of Adenoma - Now for follow-up colonoscopy & has been > or = to 3 yrs.  N/A  Polyps removed today? Yes ASA CLASS:   Class II INDICATIONS:Screening for colonic neoplasia and Colorectal Neoplasm Risk Assessment for this procedure is average risk. MEDICATIONS: Monitored anesthesia care, Propofol 200 mg IV, and Lidocaine 150 mg IV  DESCRIPTION OF PROCEDURE:   After the risks benefits and alternatives of the procedure were thoroughly explained, informed consent was obtained.  The digital rectal exam revealed no rectal mass.   The LB [id number]  endoscope was introduced through the anus and advanced to the cecum, which was identified by both the appendix and ileocecal valve. No adverse events experienced. The quality of the prep was good.  (Suprep was used)  The instrument was then slowly withdrawn as the colon was fully examined. Estimated blood loss is zero unless otherwise noted in this procedure report.  COLON FINDINGS: Three sessile polyps ranging from 3 to 9mm in size were found in the ascending colon (2) and transverse colon (1). Polypectomies were performed with cold forceps (1) and with a cold snare (2).  The resection was complete, the polyp tissue was completely retrieved and sent to histology.   The examination was otherwise normal.  Retroflexed views revealed no abnormalities. The time to cecum = 2.0 Withdrawal time = 13.2   The scope  was withdrawn and the procedure completed. COMPLICATIONS: There were no immediate complications.  ENDOSCOPIC IMPRESSION: 1.   Three sessile polyps ranging from 3 to 88mm in size were found in the ascending colon and transverse colon; polypectomies were performed with cold forceps and with a cold snare 2.   The examination was otherwise normal  RECOMMENDATIONS: 1.  Await pathology results 2.  Timing of repeat colonoscopy will be determined by pathology findings. 3.  You will receive a letter within 1-2 weeks with the results of your biopsy as well as final recommendations.  Please call my office if you have not received a letter after 3 weeks.  eSigned:  Jerene Bears, MD 10/19/2014 9:23 AM   cc: Donavan Foil, MD and The Patient

## 2014-10-19 NOTE — Progress Notes (Signed)
Called to room to assist during endoscopic procedure.  Patient ID and intended procedure confirmed with present staff. Received instructions for my participation in the procedure from the performing physician.  

## 2014-10-19 NOTE — Progress Notes (Signed)
Report to PACU, RN, vss, BBS= Clear.  

## 2014-10-19 NOTE — Patient Instructions (Signed)
YOU HAD AN ENDOSCOPIC PROCEDURE TODAY AT [facility]:   Refer to the procedure report that was given to you for any specific questions about what was found during the examination.  If the procedure report does not answer your questions, please call your gastroenterologist to clarify.  If you requested that your care partner not be given the details of your procedure findings, then the procedure report has been included in a sealed envelope for you to review at your convenience later.  YOU SHOULD EXPECT: Some feelings of bloating in the abdomen. Passage of more gas than usual.  Walking can help get rid of the air that was put into your GI tract during the procedure and reduce the bloating. If you had a lower endoscopy (such as a colonoscopy or flexible sigmoidoscopy) you may notice spotting of blood in your stool or on the toilet paper. If you underwent a bowel prep for your procedure, you may not have a normal bowel movement for a few days.  Please Note:  You might notice some irritation and congestion in your nose or some drainage.  This is from the oxygen used during your procedure.  There is no need for concern and it should clear up in a day or so.  SYMPTOMS TO REPORT IMMEDIATELY:   Following lower endoscopy (colonoscopy or flexible sigmoidoscopy):  Excessive amounts of blood in the stool  Significant tenderness or worsening of abdominal pains  Swelling of the abdomen that is new, acute  Fever of 100F or higher  For urgent or emergent issues, a gastroenterologist can be reached at any hour by calling [phone].  DIET: Your first meal following the procedure should be a small meal and then it is ok to progress to your normal diet. Heavy or fried foods are harder to digest and may make you feel nauseous or bloated.  Likewise, meals heavy in dairy and vegetables can increase bloating.  Drink plenty of fluids but you should avoid alcoholic beverages for 24 hours.  ACTIVITY:   You should plan to take it easy for the rest of today and you should NOT DRIVE or use heavy machinery until tomorrow (because of the sedation medicines used during the test).    FOLLOW UP: Our staff will call the number listed on your records the next business day following your procedure to check on you and address any questions or concerns that you may have regarding the information given to you following your procedure. If we do not reach you, we will leave a message.  However, if you are feeling well and you are not experiencing any problems, there is no need to return our call.  We will assume that you have returned to your regular daily activities without incident.  If any biopsies were taken you will be contacted by phone or by letter within the next 1-3 weeks.  Please call us at [phone] if you have not heard about the biopsies in 3 weeks.   SIGNATURES/CONFIDENTIALITY: You and/or your care partner have signed paperwork which will be entered into your electronic medical record.  These signatures attest to the fact that that the information above on your After Visit Summary has been reviewed and is understood.  Full responsibility of the confidentiality of this discharge information lies with you and/or your care-partner.  Please read over handouts about polyps  Continue your normal medications

## 2014-10-20 ENCOUNTER — Telehealth: Payer: Self-pay | Admitting: *Deleted

## 2014-10-20 NOTE — Telephone Encounter (Signed)
  Follow up Call-  Call back number 10/19/2014  Post procedure Call Back phone  # [phone]  Permission to leave phone message Yes     Patient questions:  Do you have a fever, pain , or abdominal swelling? No. Pain Score  0 *  Have you tolerated food without any problems? Yes.    Have you been able to return to your normal activities? Yes.    Do you have any questions about your discharge instructions: Diet   No. Medications  No. Follow up visit  No.  Do you have questions or concerns about your Care? No.  Actions: * If pain score is 4 or above: No action needed, pain <4.

## 2014-10-26 ENCOUNTER — Encounter: Payer: Self-pay | Admitting: Internal Medicine

## 2014-12-13 ENCOUNTER — Telehealth: Payer: Self-pay | Admitting: Endocrinology

## 2014-12-13 NOTE — Telephone Encounter (Signed)
Dr. Gaynelle Arabian [facility] needs clearance for them to do a lithotripsy for the pt's kidney stones she is faxing form over.

## 2014-12-14 NOTE — Telephone Encounter (Signed)
Form has been received. Dr. Loanne Drilling will review once he returns on 8/8. [facility] has been notified.

## 2014-12-18 ENCOUNTER — Telehealth: Payer: Self-pay | Admitting: Endocrinology

## 2014-12-18 DIAGNOSIS — N218 Other lower urinary tract calculus: Secondary | ICD-10-CM

## 2014-12-18 NOTE — Telephone Encounter (Signed)
Patient called regarding his kidney stones, to get approval for a procedure  Are you aware of this?  Please advise   Thank you

## 2014-12-19 DIAGNOSIS — N209 Urinary calculus, unspecified: Secondary | ICD-10-CM | POA: Insufficient documentation

## 2014-12-19 NOTE — Telephone Encounter (Signed)
Pt coming for a appointment tomorrow at 2:15.

## 2014-12-19 NOTE — Telephone Encounter (Signed)
done

## 2014-12-20 ENCOUNTER — Ambulatory Visit (INDEPENDENT_AMBULATORY_CARE_PROVIDER_SITE_OTHER): Payer: 59 | Admitting: Endocrinology

## 2014-12-20 ENCOUNTER — Encounter: Payer: Self-pay | Admitting: Endocrinology

## 2014-12-20 VITALS — BP 126/70 | HR 79 | Temp 97.9°F | Ht 67.0 in | Wt 168.0 lb

## 2014-12-20 DIAGNOSIS — E119 Type 2 diabetes mellitus without complications: Secondary | ICD-10-CM

## 2014-12-20 DIAGNOSIS — E0580 Other thyrotoxicosis without thyrotoxic crisis or storm: Secondary | ICD-10-CM | POA: Diagnosis not present

## 2014-12-20 LAB — HEMOGLOBIN A1C: Hgb A1c MFr Bld: 6 % (ref 4.6–6.5)

## 2014-12-20 LAB — TSH: TSH: 1.07 u[IU]/mL (ref 0.35–4.50)

## 2014-12-20 NOTE — Patient Instructions (Addendum)
check your blood sugar once a day.  vary the time of day when you check, between before the 3 meals, and at bedtime.  also check if you have symptoms of your blood sugar being too high or too low.  please keep a record of the readings and bring it to your next appointment here.  You can write it on any piece of paper.  please call us sooner if your blood sugar goes below 70, or if you have a lot of readings over 150. blood tests are requested for you today.  We'll let you know about the results.   Please come back for a regular physical appointment and day after 01/27/15. With time, your blood sugar will go back up again.

## 2014-12-20 NOTE — Progress Notes (Signed)
Subjective:    Patient ID: Andrew Morrow, male    DOB: 08-17-1955, 59 y.o.   MRN: [id number]  HPI The state of at least three ongoing medical problems is addressed today, with interval history of each noted here (he is also here for surgical clearance): Pt returns for f/u of diabetes mellitus:  DM type: 1 Dx'ed: 9485 Complications: polyneuropathy  Therapy: insulin 2011-2014; no med since then DKA: never Severe hypoglycemia: never Pancreatitis: never Other: he has not recently required medication.   Interval history: no cbg record, but states cbg's are well-controlled.  He denies hypoglycemia.  Pt also returns for f/u of hyperthyroidism (dx'ed 2000; he chose thionamide rx; he was off rx from 2005-2007, but it recurred, so he has been on tapazole since then; he has never had thyroid imaging).  he denies weight change.  HTN: denies cough Past Medical History  Diagnosis Date  . HTN (hypertension)   . Hyperthyroidism   . Diabetes mellitus without complication   . Cataract     Past Surgical History  Procedure Laterality Date  . None reported    . Tonsillectomy    . Hernia repair    . Vasectomy      Social History   Social History  . Marital Status: Married    Spouse Name: N/A  . Number of Children: N/A  . Years of Education: N/A   Occupational History  . Not on file.   Social History Main Topics  . Smoking status: Never Smoker   . Smokeless tobacco: Current User    Types: Chew  . Alcohol Use: No  . Drug Use: No  . Sexual Activity: Not on file   Other Topics Concern  . Not on file   Social History Narrative   Works as Chief Financial Officer   Married    Current Outpatient Prescriptions on File Prior to Visit  Medication Sig Dispense Refill  . acetaminophen (TYLENOL) 500 MG tablet Take 500 mg by mouth every 6 (six) hours as needed for moderate pain.    Marland Kitchen aspirin 81 MG tablet Take 81 mg by mouth every morning.     . Bisacodyl (LAXATIVE PO) Take 1 tablet by  mouth daily as needed (constipation).    . insulin lispro (HUMALOG) 100 UNIT/ML injection Inject 1 Units into the skin 3 (three) times daily before meals. As needed if blood sugar is over 200    . lisinopril-hydrochlorothiazide (PRINZIDE,ZESTORETIC) 20-25 MG per tablet TAKE 1 TABLET BY MOUTH DAILY. 30 tablet 4  . methimazole (TAPAZOLE) 10 MG tablet TAKE 1 TABLET EVERYDAY 120 tablet 1  . Multiple Vitamin (MULTIVITAMIN) tablet Take 1 tablet by mouth every morning.     . ONE TOUCH ULTRA TEST test strip 1 each by Other route 3 (three) times daily.   2   No current facility-administered medications on file prior to visit.    No Known Allergies  Family History  Problem Relation Age of Onset  . Throat cancer Mother   . Other Son     Committed Suicide  . Colon cancer Neg Hx     BP 126/70 mmHg  Pulse 79  Temp(Src) 97.9 F (36.6 C) (Oral)  Ht 5\' 7"  (1.702 m)  Wt 168 lb (76.204 kg)  BMI 26.31 kg/m2  SpO2 95%    Review of Systems Denies fever and weight change.      Objective:   Physical Exam VITAL SIGNS:  See vs page. GENERAL: no distress. Pulses: dorsalis pedis intact bilat.  MSK: no deformity of the feet. CV: no leg edema.  Skin:  no ulcer on the feet.  normal color and temp on the feet.   Neuro: sensation is intact to touch on the feet.     i personally reviewed electrocardiogram tracing: normal  Lab Results  Component Value Date   TSH 1.07 12/20/2014   Lab Results  Component Value Date   HGBA1C 6.0 12/20/2014      Assessment & Plan:  Type 1 DM, still in remission Hyperthyroidism, well-controlled HTN: well-controlled  Patient is advised the following: Patient Instructions  check your blood sugar once a day.  vary the time of day when you check, between before the 3 meals, and at bedtime.  also check if you have symptoms of your blood sugar being too high or too low.  please keep a record of the readings and bring it to your next appointment here.  You can write  it on any piece of paper.  please call us sooner if your blood sugar goes below 70, or if you have a lot of readings over 150. blood tests are requested for you today.  We'll let you know about the results.   Please come back for a regular physical appointment and day after 01/27/15. With time, your blood sugar will go back up again.    addendum: surgical risk is low and outweighed by the potential benefit of the surgery.  he is therefore medically cleared

## 2015-01-02 ENCOUNTER — Encounter (HOSPITAL_COMMUNITY): Payer: Self-pay | Admitting: *Deleted

## 2015-01-03 ENCOUNTER — Other Ambulatory Visit: Payer: Self-pay | Admitting: Urology

## 2015-01-03 NOTE — Progress Notes (Addendum)
Called Dr Arlyn Leak office for orders for ESWL on 01/04/15 at 0730.

## 2015-01-04 ENCOUNTER — Encounter (HOSPITAL_COMMUNITY)
Admission: RE | Disposition: A | Discharge status: Home / Self Care | Payer: Self-pay | Source: Ambulatory Visit | Attending: Urology

## 2015-01-04 ENCOUNTER — Encounter (HOSPITAL_COMMUNITY): Payer: Self-pay | Admitting: *Deleted

## 2015-01-04 ENCOUNTER — Ambulatory Visit (HOSPITAL_COMMUNITY)
Admission: RE | Admit: 2015-01-04 | Discharge: 2015-01-04 | Disposition: A | Discharge status: Home / Self Care | Payer: 59 | Source: Ambulatory Visit | Attending: Urology | Admitting: Urology

## 2015-01-04 ENCOUNTER — Ambulatory Visit (HOSPITAL_COMMUNITY): Payer: 59

## 2015-01-04 DIAGNOSIS — Z7982 Long term (current) use of aspirin: Secondary | ICD-10-CM | POA: Insufficient documentation

## 2015-01-04 DIAGNOSIS — I10 Essential (primary) hypertension: Secondary | ICD-10-CM | POA: Diagnosis not present

## 2015-01-04 DIAGNOSIS — R312 Other microscopic hematuria: Secondary | ICD-10-CM | POA: Diagnosis present

## 2015-01-04 DIAGNOSIS — N200 Calculus of kidney: Secondary | ICD-10-CM | POA: Diagnosis not present

## 2015-01-04 DIAGNOSIS — N521 Erectile dysfunction due to diseases classified elsewhere: Secondary | ICD-10-CM | POA: Insufficient documentation

## 2015-01-04 DIAGNOSIS — E119 Type 2 diabetes mellitus without complications: Secondary | ICD-10-CM | POA: Insufficient documentation

## 2015-01-04 DIAGNOSIS — E0500 Thyrotoxicosis with diffuse goiter without thyrotoxic crisis or storm: Secondary | ICD-10-CM | POA: Insufficient documentation

## 2015-01-04 DIAGNOSIS — N281 Cyst of kidney, acquired: Secondary | ICD-10-CM | POA: Diagnosis not present

## 2015-01-04 DIAGNOSIS — F17220 Nicotine dependence, chewing tobacco, uncomplicated: Secondary | ICD-10-CM | POA: Diagnosis not present

## 2015-01-04 DIAGNOSIS — I771 Stricture of artery: Secondary | ICD-10-CM | POA: Insufficient documentation

## 2015-01-04 DIAGNOSIS — Z79899 Other long term (current) drug therapy: Secondary | ICD-10-CM | POA: Diagnosis not present

## 2015-01-04 HISTORY — DX: Chronic kidney disease, unspecified: N18.9

## 2015-01-04 LAB — GLUCOSE, CAPILLARY: GLUCOSE-CAPILLARY: 116 mg/dL — AB (ref 65–99)

## 2015-01-04 SURGERY — LITHOTRIPSY, ESWL
Anesthesia: LOCAL | Laterality: Left

## 2015-01-04 MED ORDER — CIPROFLOXACIN HCL 500 MG PO TABS
500.0000 mg | ORAL_TABLET | ORAL | Status: AC
  Administered 2015-01-04: 500 mg via ORAL
  Filled 2015-01-04: qty 1

## 2015-01-04 MED ORDER — TAMSULOSIN HCL 0.4 MG PO CAPS: 0.4000 mg | ORAL_CAPSULE | Freq: Every day | ORAL | Status: DC

## 2015-01-04 MED ORDER — SODIUM CHLORIDE 0.9 % IV SOLN
INTRAVENOUS | Status: DC
  Administered 2015-01-04: 07:00:00 via INTRAVENOUS

## 2015-01-04 MED ORDER — OXYCODONE-ACETAMINOPHEN 5-325 MG PO TABS: 1.0000 | ORAL_TABLET | ORAL | Status: DC | PRN

## 2015-01-04 MED ORDER — DIPHENHYDRAMINE HCL 25 MG PO CAPS
25.0000 mg | ORAL_CAPSULE | ORAL | Status: AC
  Administered 2015-01-04: 25 mg via ORAL
  Filled 2015-01-04: qty 1

## 2015-01-04 MED ORDER — DIAZEPAM 5 MG PO TABS
10.0000 mg | ORAL_TABLET | ORAL | Status: AC
  Administered 2015-01-04: 10 mg via ORAL
  Filled 2015-01-04: qty 2

## 2015-01-04 NOTE — Progress Notes (Signed)
Patient states he has not had aspirin advil or any over the counter medications in the last 3 days

## 2015-01-04 NOTE — H&P (Signed)
Reason For Visit Cystoscopy/CT results   Active Problems Problems  1. Benign prostatic hypertrophy without lower urinary tract symptoms (N40.0) 2. 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 Microscopic hematuria (R31.2) 4. Nephrolithiasis (N20.0)  History of Present Illness    59 yo borderline ID diabetic male with Grave's disease, returns today for a cystoscopy & to review CT results for hx of microhematuria. Originally referred by Dr. Loanne Drilling for further evaluation of microhematuria. He also has ED. No sodas anymore. no sweet tea anymore. No voiding symptoms.   Past Medical History Problems  1. History of depression (Z86.59) 2. History of hypertension (Z86.79)  Surgical History Problems  1. History of Hernia Repair 2. History of Surgery Of Male Genitalia Vasectomy 3. History of Tonsillectomy  Current Meds 1. Aspirin 81 MG TABS;  Therapy: (Recorded:08Jun2016) to Recorded 2. Lisinopril-Hydrochlorothiazide 20-25 MG Oral Tablet;  Therapy: (Recorded:08Jun2016) to Recorded 3. Methimazole 10 MG Oral Tablet;  Therapy: (Recorded:08Jun2016) to Recorded 4. Multiple Vitamin TABS;  Therapy: (Recorded:08Jun2016) to Recorded 5. Sildenafil Citrate 20 MG Oral Tablet; Take 2-3 tabs po as needed for sexual activity;  Therapy: [id number] to (Last Rx:08Jun2016)  Requested for: [id number] Ordered  Allergies Medication  1. No Known Drug Allergies  Family History Problems  1. Family history of Deceased : Mother, Father 2. Family history of cardiac disorder (Z82.49) : Mother 3. Family history of malignant neoplasm (Z80.9) : Father  Social History Problems  1. Never a smoker 2. No alcohol use 3. No caffeine use 4. Number of children   2 sons 73. Occupation   Research officer, trade union  Review of Systems Genitourinary, constitutional, skin, eye, otolaryngeal, hematologic/lymphatic, cardiovascular, pulmonary, endocrine, musculoskeletal, gastrointestinal, neurological and  psychiatric system(s) were reviewed and pertinent findings if present are noted and are otherwise negative.  Genitourinary: hematuria and erectile dysfunction.  Musculoskeletal: joint pain.    Vitals Vital Signs [Data Includes: Last 1 Day]  Recorded: 12Jul2016 10:00AM  Blood Pressure: 137 / 81 Temperature: 98.3 F Heart Rate: 74  Physical Exam Constitutional: Well nourished and well developed . No acute distress.  ENT:. The ears and nose are normal in appearance.  Neck: The appearance of the neck is normal and no neck mass is present.  Pulmonary: No respiratory distress and normal respiratory rhythm and effort.  Cardiovascular: Heart rate and rhythm are normal . No peripheral edema.  Abdomen: The abdomen is soft and nontender. No masses are palpated. No CVA tenderness. No hernias are palpable. No hepatosplenomegaly noted.  Genitourinary: Examination of the penis demonstrates no discharge, no masses, no lesions and a normal meatus. The scrotum is normal in appearance and without lesions. The right epididymis is palpably normal and non-tender. The left epididymis is palpably normal and non-tender. The right testis is palpably normal, non-tender and without masses. The left testis is normal, non-tender and without masses.  Lymphatics: The femoral and inguinal nodes are not enlarged or tender.  Skin: Normal skin turgor, no visible rash and no visible skin lesions.  Neuro/Psych:. Mood and affect are appropriate.    Results/Data Urine [Data Includes: Last 1 Day]   [id number]  COLOR YELLOW   APPEARANCE CLEAR   SPECIFIC GRAVITY 1.020   pH 6.5   GLUCOSE NEG mg/dL  BILIRUBIN NEG   KETONE NEG mg/dL  BLOOD SMALL   PROTEIN NEG mg/dL  UROBILINOGEN 0.2 mg/dL  NITRITE NEG   LEUKOCYTE ESTERASE NEG   SQUAMOUS EPITHELIAL/HPF NONE SEEN   WBC NONE SEEN WBC/hpf  RBC 0-2 RBC/hpf  BACTERIA NONE SEEN  CRYSTALS NONE SEEN   CASTS NONE SEEN   Selected Results  AU CT-HEMATURIA PROTOCOL [id number]  12:00AM Carolan Clines   Test Name Result Flag Reference  AU CT-HEMATURIA PROTOCOL (Report)    ** RADIOLOGY REPORT BY [facility] **   CLINICAL DATA: 59 year old male with chronic history of micro hematuria. No associated pain, nausea, vomiting, fever or weight loss. No history of kidney stones.  EXAM: CT ABDOMEN AND PELVIS WITHOUT AND WITH CONTRAST  TECHNIQUE: Multidetector CT imaging of the abdomen and pelvis was performed following the standard protocol before and following the bolus administration of intravenous contrast.  CONTRAST: 125 mL of Isovue-300.  COMPARISON: No priors.  FINDINGS: Lower chest: Mild subpleural emphysematous changes.  Hepatobiliary: No cystic or solid hepatic lesions. No intra or extrahepatic biliary ductal dilatation. 3 mm calcified gallstone lying dependently in the gallbladder. No findings to suggest an acute cholecystitis at this time.  Pancreas: No pancreatic mass. No pancreatic ductal dilatation. No pancreatic or peripancreatic fluid or inflammatory changes.  Spleen: Unremarkable.  Adrenals/Urinary Tract: 8 mm calculus in the upper pole collecting system of the left kidney. No additional calculi within the right renal collecting system, along the course of either ureter, or within the lumen of the urinary bladder. No hydroureteronephrosis or perinephric stranding to indicate urinary tract obstruction at this time. Exophytic 1 cm low-attenuation lesion in the lower pole of the right kidney is compatible with a simple cyst. No other suspicious renal lesions are noted. Postcontrast delayed images demonstrate no filling defect within the collecting system of either kidney, along the course of either ureter, or within the lumen of the urinary bladder to strongly suggest the presence of a urothelial neoplasm at this time. Urinary bladder is normal in appearance. Bilateral adrenal glands are normal in  appearance.  Stomach/Bowel: Stomach is normal in appearance. No pathologic dilatation of small bowel or colon. Normal appendix.  Vascular/Lymphatic: Atherosclerosis throughout the abdominal and pelvic vasculature, without evidence of aneurysm or dissection. No lymphadenopathy noted in the abdomen or pelvis.  Reproductive: Prostate gland and seminal vesicles are unremarkable in appearance.  Other: Small left inguinal hernia containing only fat. 15 mm coarse calcification in the low anatomic pelvis immediately anterior to the rectum is a benign finding, likely an area of chronic fat fibrosis from prior torsed epiploic appendage. No significant volume of ascites. No pneumoperitoneum.  Musculoskeletal: There are no aggressive appearing lytic or blastic lesions noted in the visualized portions of the skeleton.  IMPRESSION: 1. 8 mm calculus in the upper pole collecting system of left kidney. No ureteral stones or findings of urinary tract obstruction are noted at this time. 2. 1 cm simple cyst in the lower pole of the right kidney incidentally noted. No suspicious renal lesions. 3. Cholelithiasis without evidence of acute cholecystitis at this time. 4. Atherosclerosis. 5. Additional incidental findings, as above.   Electronically Signed  By: Vinnie Langton M.D.  On: 11/14/2014 09:12   CREATININE with eGFR [id number] 12:26PM Carolan Clines  SPECIMEN TYPE: BLOOD   Test Name Result Flag Reference  CREATININE 1.10 mg/dL  0.50-1.50  Est GFR, African American 85 mL/min    Est GFR, NonAfrican American 74 mL/min    THE ESTIMATED GFR IS A CALCULATION VALID FOR ADULTS (>=28 YEARS OLD) THAT USES THE CKD-EPI ALGORITHM TO ADJUST FOR AGE AND SEX. IT IS   NOT TO BE USED FOR CHILDREN, PREGNANT WOMEN, HOSPITALIZED PATIENTS,    PATIENTS ON DIALYSIS, OR WITH RAPIDLY CHANGING KIDNEY FUNCTION. ACCORDING TO  THE NKDEP, EGFR >89 IS NORMAL, 60-89 SHOWS MILD IMPAIRMENT, 30-59 SHOWS MODERATE  IMPAIRMENT, 15-29 SHOWS SEVERE IMPAIRMENT AND <15 IS ESRD.   PSA REFLEX TO FREE [id number] 12:26PM Carolan Clines  SPECIMEN TYPE: BLOOD   Test Name Result Flag Reference  PSA 1.40 ng/mL  <=4.00  TEST METHODOLOGY: ECLIA PSA (ELECTROCHEMILUMINESCENCE IMMUNOASSAY)   Procedure  Procedure: Cystoscopy  Chaperone Present: kim lewis.  Indication: Hematuria.  Informed Consent: Risks, benefits, and potential adverse events were discussed and informed consent was obtained from the patient.  Prep: The patient was prepped with betadine.  Anesthesia:. Local anesthesia was administered intraurethrally with 2% lidocaine jelly.  Antibiotic prophylaxis: Ciprofloxacin.  Procedure Note:  Urethral meatus:. No abnormalities.  Anterior urethra: No abnormalities.  Prostatic urethra: No abnormalities.  Bladder: Visulization was clear. The ureteral orifices were in the normal anatomic position bilaterally and had clear efflux of urine. A systematic survey of the bladder demonstrated no bladder tumors or stones. The mucosa was smooth without abnormalities. The patient tolerated the procedure well.  Complications: None.    Assessment Assessed  1. 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 Microscopic hematuria (R31.2) 3. Nephrolithiasis (N20.0)  68m L renal stone, upper pole. Needs KUB to if he is lithotripsy candidate.   ED: wants sildenefil  Note family hx of Ca lung ( smokers). Pt is tobacco chewer-advised to STOP. Increased chances of bladder and kidney cancer.   Plan Erectile dysfunction due to arterial insufficiency  1. KUB; Status:Hold For - Appointment,Date of Service; Requested for:12Jul2016;  Health Maintenance  2. UA With REFLEX; [Do Not Release]; Status:Resulted - Requires Verification;   Done::  [id number]:37AM  1. Kidney stone. KUB today.   2. ED-Sildenefil.   Discussion/Summary cc: SRenato Shin MD     Signatures Electronically signed by : SCarolan Clines M.D.; Nov 21 2014 10:36AM EST

## 2015-01-04 NOTE — Discharge Instructions (Signed)
Dietary Guidelines to Help Prevent Kidney Stones Your risk of kidney stones can be decreased by adjusting the foods you eat. The most important thing you can do is drink enough fluid. You should drink enough fluid to keep your urine clear or pale yellow. The following guidelines provide specific information for the type of kidney stone you have had. GUIDELINES ACCORDING TO TYPE OF KIDNEY STONE Calcium Oxalate Kidney Stones  Reduce the amount of salt you eat. Foods that have a lot of salt cause your body to release excess calcium into your urine. The excess calcium can combine with a substance called oxalate to form kidney stones.  Reduce the amount of animal protein you eat if the amount you eat is excessive. Animal protein causes your body to release excess calcium into your urine. Ask your dietitian how much protein from animal sources you should be eating.  Avoid foods that are high in oxalates. If you take vitamins, they should have less than 500 mg of vitamin C. Your body turns vitamin C into oxalates. You do not need to avoid fruits and vegetables high in vitamin C. Calcium Phosphate Kidney Stones  Reduce the amount of salt you eat to help prevent the release of excess calcium into your urine.  Reduce the amount of animal protein you eat if the amount you eat is excessive. Animal protein causes your body to release excess calcium into your urine. Ask your dietitian how much protein from animal sources you should be eating.  Get enough calcium from food or take a calcium supplement (ask your dietitian for recommendations). Food sources of calcium that do not increase your risk of kidney stones include:  Broccoli.  Dairy products, such as cheese and yogurt.  Pudding. Uric Acid Kidney Stones  Do not have more than 6 oz of animal protein per day. FOOD SOURCES Animal Protein Sources  Meat (all types).  Poultry.  Eggs.  Fish, seafood. Foods High in [employer] seasonings.  Soy  sauce.  Teriyaki sauce.  Cured and processed meats.  Salted crackers and snack foods.  Fast food.  Canned soups and most canned foods. Foods High in Oxalates  Grains:  Amaranth.  Barley.  Grits.  Wheat germ.  Bran.  Buckwheat flour.  All bran cereals.  Pretzels.  Whole wheat bread.  Vegetables:  Beans (wax).  Beets and beet greens.  Collard greens.  Eggplant.  [place]ole.  Leeks.  Okra.  Parsley.  Rutabagas.  Spinach.  Swiss chard.  Tomato paste.  Fried potatoes.  Sweet potatoes.  Fruits:  Red currants.  Figs.  Kiwi.  Rhubarb.  Meat and Other Protein Sources:  Beans (dried).  Soy burgers and other soybean products.  Miso.  Nuts (peanuts, almonds, pecans, cashews, hazelnuts).  Nut butters.  Sesame seeds and tahini (paste made of sesame seeds).  Poppy seeds.  Beverages:  Chocolate drink mixes.  Soy milk.  Instant iced tea.  Juices made from high-oxalate fruits or vegetables.  Other:  Carob.  Chocolate.  Fruitcake.  Marmalades. Document Released: 08/23/2010 Document Revised: 05/03/2013 Document Reviewed: 03/25/2013 [facility] Patient Information 2015 [place], [place]. This information is not intended to replace advice given to you by your health care provider. Make sure you discuss any questions you have with your health care provider.  Lithotripsy for Kidney Stones Lithotripsy is a treatment that can sometimes help eliminate kidney stones and pain that they cause. A form of lithotripsy, also known as extracorporeal shock wave lithotripsy, is a nonsurgical procedure that  helps your body rid itself of the kidney stone when it is too big to pass on its own. Extracorporeal shock wave lithotripsy is a method of crushing a kidney stone with shock waves. These shock waves pass through your body and are focused on your stone. They cause the kidney stones to crumble while still in the urinary tract. It is then easier for  the smaller pieces of stone to pass in the urine. Lithotripsy usually takes about an hour. It is done in a hospital, a lithotripsy center, or a mobile unit. It usually does not require an overnight stay. Your health care provider will instruct you on preparation for the procedure. Your health care provider will tell you what to expect afterward. LET [facility] CARE PROVIDER KNOW ABOUT:  Any allergies you have.  All medicines you are taking, including vitamins, herbs, eye drops, creams, and over-the-counter medicines.  Previous problems you or members of your family have had with the use of anesthetics.  Any blood disorders you have.  Previous surgeries you have had.  Medical conditions you have. RISKS AND COMPLICATIONS Generally, lithotripsy for kidney stones is a safe procedure. However, as with any procedure, complications can occur. Possible complications include:  Infection.  Bleeding of the kidney.  Bruising of the kidney or skin.  Obstruction of the ureter.  Failure of the stone to fragment. BEFORE THE PROCEDURE  Do not eat or drink for 6-8 hours prior to the procedure. You may, however, take the medications with a sip of water that your physician instructs you to take  Do not take aspirin or aspirin-containing products for 7 days prior to your procedure  Do not take nonsteroidal anti-inflammatory products for 7 days prior to your procedure PROCEDURE A stent (flexible tube with holes) may be placed in your ureter. The ureter is the tube that transports the urine from the kidneys to the bladder. Your health care provider may place a stent before the procedure. This will help keep urine flowing from the kidney if the fragments of the stone block the ureter. You may have an IV tube placed in one of your veins to give you fluids and medicines. These medicines may help you relax or make you sleep. During the procedure, you will lie comfortably on a fluid-filled cushion or in a  warm-water bath. After an X-ray or ultrasound exam to locate your stone, shock waves are aimed at the stone. If you are awake, you may feel a tapping sensation as the shock waves pass through your body. If large stone particles remain after treatment, a second procedure may be necessary at a later date. For comfort during the test:  Relax as much as possible.  Try to remain still as much as possible.  Try to follow instructions to speed up the test.  Let your health care provider know if you are uncomfortable, anxious, or in pain. AFTER THE PROCEDURE  After surgery, you will be taken to the recovery area. A nurse will watch and check your progress. Once you're awake, stable, and taking fluids well, you will be allowed to go home as long as there are no problems. You will also be allowed to pass your urine before discharge.You may be given antibiotics to help prevent infection. You may also be prescribed pain medicine if needed. In a week or two, your health care provider may remove your stent, if you have one. You may first have an X-ray exam to check on how successful the fragmentation  of your stone has been and how much of the stone has passed. Your health care provider will check to see whether or not stone particles remain. SEEK IMMEDIATE MEDICAL CARE IF:  You develop a fever or shaking chills.  Your pain is not relieved by medicine.  You feel sick to your stomach (nauseated) and you vomit.  You develop heavy bleeding.  You have difficulty urinating.  You start to pass your stent from your penis. Document Released: 04/25/2000 Document Revised: 02/16/2013 Document Reviewed: 11/11/2012 [facility] Patient Information 2015 [place], [place]. This information is not intended to replace advice given to you by your health care provider. Make sure you discuss any questions you have with your health care provider.

## 2015-01-04 NOTE — Interval H&P Note (Signed)
History and Physical Interval Note:  01/04/2015 7:28 AM  Andrew Morrow  has presented today for surgery, with the diagnosis of left upper pole stone  The various methods of treatment have been discussed with the patient and family. After consideration of risks, benefits and other options for treatment, the patient has consented to  Procedure(s): LEFT EXTRACORPOREAL SHOCK WAVE LITHOTRIPSY (ESWL) (Left) as a surgical intervention .  The patient's history has been reviewed, patient examined, no change in status, stable for surgery.  I have reviewed the patient's chart and labs.  Questions were answered to the patient's satisfaction.     [name]

## 2015-01-30 ENCOUNTER — Encounter: Payer: 59 | Admitting: Endocrinology

## 2015-01-30 ENCOUNTER — Ambulatory Visit: Payer: 59 | Admitting: Endocrinology

## 2015-03-04 ENCOUNTER — Other Ambulatory Visit: Payer: Self-pay | Admitting: Endocrinology

## 2015-03-29 ENCOUNTER — Encounter: Payer: Self-pay | Admitting: Endocrinology

## 2015-03-29 ENCOUNTER — Ambulatory Visit (INDEPENDENT_AMBULATORY_CARE_PROVIDER_SITE_OTHER): Payer: 59 | Admitting: Endocrinology

## 2015-03-29 VITALS — BP 124/86 | HR 60 | Temp 98.3°F | Ht 67.0 in | Wt 163.0 lb

## 2015-03-29 DIAGNOSIS — E0580 Other thyrotoxicosis without thyrotoxic crisis or storm: Secondary | ICD-10-CM | POA: Diagnosis not present

## 2015-03-29 DIAGNOSIS — E119 Type 2 diabetes mellitus without complications: Secondary | ICD-10-CM | POA: Diagnosis not present

## 2015-03-29 DIAGNOSIS — I10 Essential (primary) hypertension: Secondary | ICD-10-CM | POA: Diagnosis not present

## 2015-03-29 DIAGNOSIS — Z0000 Encounter for general adult medical examination without abnormal findings: Secondary | ICD-10-CM

## 2015-03-29 DIAGNOSIS — E7800 Pure hypercholesterolemia, unspecified: Secondary | ICD-10-CM

## 2015-03-29 DIAGNOSIS — Z23 Encounter for immunization: Secondary | ICD-10-CM

## 2015-03-29 DIAGNOSIS — Z125 Encounter for screening for malignant neoplasm of prostate: Secondary | ICD-10-CM

## 2015-03-29 LAB — URINALYSIS, ROUTINE W REFLEX MICROSCOPIC
Bilirubin Urine: NEGATIVE
Ketones, ur: NEGATIVE
Leukocytes, UA: NEGATIVE
Nitrite: NEGATIVE
PH: 5.5 (ref 5.0–8.0)
TOTAL PROTEIN, URINE-UPE24: NEGATIVE
URINE GLUCOSE: NEGATIVE
Urobilinogen, UA: 0.2 (ref 0.0–1.0)

## 2015-03-29 LAB — HEPATIC FUNCTION PANEL
ALBUMIN: 4.5 g/dL (ref 3.5–5.2)
ALK PHOS: 83 U/L (ref 39–117)
ALT: 15 U/L (ref 0–53)
AST: 14 U/L (ref 0–37)
BILIRUBIN DIRECT: 0.1 mg/dL (ref 0.0–0.3)
TOTAL PROTEIN: 7.3 g/dL (ref 6.0–8.3)
Total Bilirubin: 0.4 mg/dL (ref 0.2–1.2)

## 2015-03-29 LAB — CBC WITH DIFFERENTIAL/PLATELET
BASOS ABS: 0 10*3/uL (ref 0.0–0.1)
BASOS PCT: 0.5 % (ref 0.0–3.0)
Eosinophils Absolute: 0.2 10*3/uL (ref 0.0–0.7)
Eosinophils Relative: 1.7 % (ref 0.0–5.0)
HEMATOCRIT: 47.4 % (ref 39.0–52.0)
Hemoglobin: 15.9 g/dL (ref 13.0–17.0)
LYMPHS ABS: 1.8 10*3/uL (ref 0.7–4.0)
LYMPHS PCT: 19.2 % (ref 12.0–46.0)
MCHC: 33.5 g/dL (ref 30.0–36.0)
MCV: 89.2 fl (ref 78.0–100.0)
MONOS PCT: 5.5 % (ref 3.0–12.0)
Monocytes Absolute: 0.5 10*3/uL (ref 0.1–1.0)
NEUTROS ABS: 6.7 10*3/uL (ref 1.4–7.7)
NEUTROS PCT: 73.1 % (ref 43.0–77.0)
PLATELETS: 218 10*3/uL (ref 150.0–400.0)
RBC: 5.31 Mil/uL (ref 4.22–5.81)
RDW: 13.2 % (ref 11.5–15.5)
WBC: 9.1 10*3/uL (ref 4.0–10.5)

## 2015-03-29 LAB — LIPID PANEL
CHOL/HDL RATIO: 4
Cholesterol: 188 mg/dL (ref 0–200)
HDL: 42 mg/dL (ref >39.00)
LDL CALC: 123 mg/dL — AB (ref 0–99)
NONHDL: 146.13
Triglycerides: 115 mg/dL (ref 0.0–149.0)
VLDL: 23 mg/dL (ref 0.0–40.0)

## 2015-03-29 LAB — MICROALBUMIN / CREATININE URINE RATIO
Creatinine,U: 243.4 mg/dL
MICROALB UR: 1.5 mg/dL (ref 0.0–1.9)
Microalb Creat Ratio: 0.6 mg/g (ref 0.0–30.0)

## 2015-03-29 LAB — BASIC METABOLIC PANEL
BUN: 19 mg/dL (ref 6–23)
CALCIUM: 9.5 mg/dL (ref 8.4–10.5)
CO2: 29 meq/L (ref 19–32)
CREATININE: 1.29 mg/dL (ref 0.40–1.50)
Chloride: 102 mEq/L (ref 96–112)
GFR: 60.69 mL/min (ref >60.00)
GLUCOSE: 107 mg/dL — AB (ref 70–99)
Potassium: 3.9 mEq/L (ref 3.5–5.1)
SODIUM: 141 meq/L (ref 135–145)

## 2015-03-29 LAB — PSA: PSA: 1.11 ng/mL (ref 0.10–4.00)

## 2015-03-29 LAB — TSH: TSH: 2.01 u[IU]/mL (ref 0.35–4.50)

## 2015-03-29 LAB — POCT GLYCOSYLATED HEMOGLOBIN (HGB A1C): Hemoglobin A1C: 5.6

## 2015-03-29 NOTE — Progress Notes (Signed)
Subjective:    Patient ID: Andrew Morrow, male    DOB: 10-01-1955, 59 y.o.   MRN: [id number]  HPI Pt is here for regular wellness examination, and is feeling pretty well in general, and says chronic med probs are stable, except as noted below. Past Medical History  Diagnosis Date  . HTN (hypertension)   . Hyperthyroidism   . Diabetes mellitus without complication ([place])   . Cataract   . Chronic kidney disease     left upper pole stone    Past Surgical History  Procedure Laterality Date  . None reported    . Tonsillectomy    . Hernia repair    . Vasectomy      Social History   Social History  . Marital Status: Married    Spouse Name: N/A  . Number of Children: N/A  . Years of Education: N/A   Occupational History  . Not on file.   Social History Main Topics  . Smoking status: Never Smoker   . Smokeless tobacco: Current User    Types: Chew  . Alcohol Use: No  . Drug Use: No  . Sexual Activity: Not on file   Other Topics Concern  . Not on file   Social History Narrative   Works as Chief Financial Officer   Married    Current Outpatient Prescriptions on File Prior to Visit  Medication Sig Dispense Refill  . acetaminophen (TYLENOL) 500 MG tablet Take 500 mg by mouth every 6 (six) hours as needed for moderate pain.    Marland Kitchen aspirin 81 MG tablet Take 81 mg by mouth every morning.     . Bisacodyl (LAXATIVE PO) Take 1 tablet by mouth daily as needed (constipation).    Marland Kitchen lisinopril-hydrochlorothiazide (PRINZIDE,ZESTORETIC) 20-25 MG per tablet TAKE 1 TABLET BY MOUTH DAILY. 30 tablet 4  . methimazole (TAPAZOLE) 10 MG tablet TAKE 1 TABLET EVERYDAY 120 tablet 1  . Multiple Vitamin (MULTIVITAMIN) tablet Take 1 tablet by mouth every morning.     . ONE TOUCH ULTRA TEST test strip 1 each by Other route 3 (three) times daily.   2  . ONE TOUCH ULTRA TEST test strip USE 4 TIMES STRIPS PER DAY BEFORE EACH MEAL AND AT BEDTIME DIAGNOSIS CODE 250.03 200 each 1  . tamsulosin (FLOMAX)  0.4 MG CAPS capsule Take 1 capsule (0.4 mg total) by mouth daily. 30 capsule 0   No current facility-administered medications on file prior to visit.    No Known Allergies  Family History  Problem Relation Age of Onset  . Throat cancer Mother   . Other Son     Committed Suicide  . Colon cancer Neg Hx     BP 124/86 mmHg  Pulse 60  Temp(Src) 98.3 F (36.8 C) (Oral)  Ht 5\' 7"  (1.702 m)  Wt 163 lb (73.936 kg)  BMI 25.52 kg/m2  SpO2 97%  Review of Systems  Constitutional: Negative for fever and unexpected weight change.  HENT: Negative for hearing loss.   Eyes: Negative for visual disturbance.  Respiratory: Negative for shortness of breath.   Cardiovascular: Negative for chest pain.  Gastrointestinal: Negative for anal bleeding.  Endocrine: Negative for cold intolerance.  Genitourinary: Negative for hematuria and difficulty urinating.  Musculoskeletal: Negative for back pain.  Skin: Negative for rash.  Allergic/Immunologic: Negative for environmental allergies.  Neurological: Negative for syncope and numbness.  Hematological: Does not bruise/bleed easily.  Psychiatric/Behavioral: Negative for dysphoric mood.       Objective:  Physical Exam VS: see vs page GEN: no distress HEAD: head: no deformity eyes: no periorbital swelling, no proptosis external nose and ears are normal mouth: no lesion seen NECK: supple, thyroid is not enlarged CHEST WALL: no deformity LUNGS: clear to auscultation BREASTS:  No gynecomastia CV: reg rate and rhythm, no murmur ABD: abdomen is soft, nontender.  no hepatosplenomegaly.  not distended.  no hernia GENITALIA/RECTAL/PROSTATE: sees urology.   MUSCULOSKELETAL: muscle bulk and strength are grossly normal.  no obvious joint swelling.  gait is normal and steady.   EXTEMITIES: no deformity.  no ulcer on the feet.  feet are of normal color and temp.  no edema PULSES: dorsalis pedis intact bilat.  no carotid bruit NEURO:  cn 2-12 grossly  intact.   readily moves all 4's.  sensation is intact to touch on the feet SKIN:  Normal texture and temperature.  No rash or suspicious lesion is visible.   NODES:  None palpable at the neck PSYCH: alert, well-oriented.  Does not appear anxious nor depressed.       Assessment & Plan:  Wellness visit today, with problems stable, except as noted.   Patient is advised the following: Patient Instructions  blood tests are requested for you today.  We'll let you know about the results.  please consider these measures for your health:  minimize alcohol.  do not use tobacco products.  have a colonoscopy at least every 10 years from age 20.  keep firearms safely stored.  always use seat belts.  have working smoke alarms in your home.  see an eye doctor and dentist regularly.  never drive under the influence of alcohol or drugs (including prescription drugs).  those with fair skin should take precautions against the sun.  Please come back for a follow-up appointment in 6 months.

## 2015-03-29 NOTE — Patient Instructions (Addendum)
blood tests are requested for you today.  We'll let you know about the results.  please consider these measures for your health:  minimize alcohol.  do not use tobacco products.  have a colonoscopy at least every 10 years from age 59.  keep firearms safely stored.  always use seat belts.  have working smoke alarms in your home.  see an eye doctor and dentist regularly.  never drive under the influence of alcohol or drugs (including prescription drugs).  those with fair skin should take precautions against the sun.  Please come back for a follow-up appointment in 6 months.

## 2015-06-02 ENCOUNTER — Other Ambulatory Visit: Payer: Self-pay | Admitting: Endocrinology

## 2015-08-09 ENCOUNTER — Other Ambulatory Visit: Payer: Self-pay | Admitting: Endocrinology

## 2015-09-26 ENCOUNTER — Ambulatory Visit: Payer: 59 | Admitting: Endocrinology

## 2015-10-25 ENCOUNTER — Encounter: Payer: Self-pay | Admitting: Endocrinology

## 2015-10-25 ENCOUNTER — Ambulatory Visit (INDEPENDENT_AMBULATORY_CARE_PROVIDER_SITE_OTHER): Payer: 59 | Admitting: Endocrinology

## 2015-10-25 VITALS — BP 110/62 | HR 81 | Ht 67.0 in | Wt 164.0 lb

## 2015-10-25 DIAGNOSIS — E7800 Pure hypercholesterolemia, unspecified: Secondary | ICD-10-CM | POA: Diagnosis not present

## 2015-10-25 DIAGNOSIS — E119 Type 2 diabetes mellitus without complications: Secondary | ICD-10-CM | POA: Diagnosis not present

## 2015-10-25 DIAGNOSIS — E0580 Other thyrotoxicosis without thyrotoxic crisis or storm: Secondary | ICD-10-CM

## 2015-10-25 LAB — LIPID PANEL
CHOL/HDL RATIO: 4
Cholesterol: 185 mg/dL (ref 0–200)
HDL: 42 mg/dL (ref >39.00)
LDL CALC: 110 mg/dL — AB (ref 0–99)
NONHDL: 143.49
Triglycerides: 166 mg/dL — ABNORMAL HIGH (ref 0.0–149.0)
VLDL: 33.2 mg/dL (ref 0.0–40.0)

## 2015-10-25 LAB — TSH: TSH: 3.17 u[IU]/mL (ref 0.35–4.50)

## 2015-10-25 LAB — HEMOGLOBIN A1C: Hgb A1c MFr Bld: 6 % (ref 4.6–6.5)

## 2015-10-25 LAB — POCT GLYCOSYLATED HEMOGLOBIN (HGB A1C): Hemoglobin A1C: 5.9

## 2015-10-25 MED ORDER — ATORVASTATIN CALCIUM 10 MG PO TABS: 10.0000 mg | ORAL_TABLET | Freq: Every day | ORAL | Status: DC

## 2015-10-25 MED ORDER — GLUCOSE BLOOD VI STRP: 1.0000 | ORAL_STRIP | Freq: Every day | Status: DC

## 2015-10-25 NOTE — Progress Notes (Signed)
Subjective:    Patient ID: Andrew Morrow, male    DOB: [birth year], 60 y.o.   MRN: [id number]  HPI The state of at least three ongoing medical problems is addressed today, with interval history of each noted here (he is also here for surgical clearance): Pt returns for f/u of diabetes mellitus:  DM type: 1, in remission Dx'ed: [id number] Complications: polyneuropathy  Therapy: insulin 2011-2014; no med since then DKA: never Severe hypoglycemia: never Pancreatitis: never Other: he has not recently required medication.   Interval history: no cbg record, but states cbg's are well-controlled.  He denies hypoglycemia.  Pt also returns for f/u of hyperthyroidism (dx'ed 2000; he chose thionamide rx; he was off rx from 2005-2007, but it recurred, so he has been on tapazole since then; he has never had thyroid imaging).  he denies fever. HTN: denies edema.  Dyslipidemia:  No weight change.  Past Medical History  Diagnosis Date  . HTN (hypertension)   . Hyperthyroidism   . Diabetes mellitus without complication ([place])   . Cataract   . Chronic kidney disease     left upper pole stone    Past Surgical History  Procedure Laterality Date  . None reported    . Tonsillectomy    . Hernia repair    . Vasectomy      Social History   Social History  . Marital Status: Married    Spouse Name: N/A  . Number of Children: N/A  . Years of Education: N/A   Occupational History  . Not on file.   Social History Main Topics  . Smoking status: Never Smoker   . Smokeless tobacco: Current User    Types: Chew  . Alcohol Use: No  . Drug Use: No  . Sexual Activity: Not on file   Other Topics Concern  . Not on file   Social History Narrative   Works as Chief Financial Officer   Married    Current Outpatient Prescriptions on File Prior to Visit  Medication Sig Dispense Refill  . acetaminophen (TYLENOL) 500 MG tablet Take 500 mg by mouth every 6 (six) hours as needed for moderate pain.    Marland Kitchen  aspirin 81 MG tablet Take 81 mg by mouth every morning.     . Bisacodyl (LAXATIVE PO) Take 1 tablet by mouth daily as needed (constipation).    Marland Kitchen lisinopril-hydrochlorothiazide (PRINZIDE,ZESTORETIC) 20-25 MG tablet TAKE 1 TABLET BY MOUTH DAILY. 30 tablet 4  . methimazole (TAPAZOLE) 10 MG tablet TAKE 1 TABLET EVERYDAY 120 tablet 0  . Multiple Vitamin (MULTIVITAMIN) tablet Take 1 tablet by mouth every morning.     . sildenafil (REVATIO) 20 MG tablet 2-5 pills as needed for ED symptoms    . tamsulosin (FLOMAX) 0.4 MG CAPS capsule Take 1 capsule (0.4 mg total) by mouth daily. 30 capsule 0   No current facility-administered medications on file prior to visit.    No Known Allergies  Family History  Problem Relation Age of Onset  . Throat cancer Mother   . Other Son     Committed Suicide  . Colon cancer Neg Hx     BP 110/62 mmHg  Pulse 81  Ht 5\' 7"  (1.702 m)  Wt 164 lb (74.39 kg)  BMI 25.68 kg/m2  SpO2 96%  Review of Systems Denies fever and sob.       Objective:   Physical Exam VITAL SIGNS:  See vs page GENERAL: no distress Pulses: dorsalis pedis intact bilat.   MSK:  no deformity of the feet CV: no leg edema Skin:  no ulcer on the feet.  normal color and temp on the feet. Neuro: sensation is intact to touch on the feet.   A1c=5.9%  Lab Results  Component Value Date   CHOL 185 10/25/2015   HDL 42.00 10/25/2015   LDLCALC 110* 10/25/2015   LDLDIRECT 110.9 06/02/2012   TRIG 166.0* 10/25/2015   CHOLHDL 4 10/25/2015   Lab Results  Component Value Date   TSH 3.17 10/25/2015      Assessment & Plan:  HTN: with situational component.  Type 1 DM: still in remission.   Dyslipidemia: he needs increased rx Hyperthyroidism: well-controlled.  Please continue the same medication.    Patient is advised the following: Patient Instructions  A thyroid blood test is requested for you today.  We'll let you know about the results. i have sent a prescription to your pharmacy, for  the cholesterol Please continue the same medication for blood pressure No medication is needed for diabetes now Please come back for a regular physical appointment in 5 months (must be after 03/28/16).

## 2015-10-25 NOTE — Patient Instructions (Addendum)
A thyroid blood test is requested for you today.  We'll let you know about the results. i have sent a prescription to your pharmacy, for the cholesterol Please continue the same medication for blood pressure No medication is needed for diabetes now Please come back for a regular physical appointment in 5 months (must be after 03/28/16).

## 2015-11-11 ENCOUNTER — Other Ambulatory Visit: Payer: Self-pay | Admitting: Endocrinology

## 2016-01-01 ENCOUNTER — Other Ambulatory Visit: Payer: Self-pay | Admitting: Endocrinology

## 2016-03-24 NOTE — Progress Notes (Signed)
Subjective:    Patient ID: Andrew Morrow, male    DOB: Sep 29, 1955, 60 y.o.   MRN: [id number]  HPI The state of at least three ongoing medical problems is addressed today, with interval history of each noted here:  Pt returns for f/u of diabetes mellitus:  DM type: 1, in remission Dx'ed: [id number] Complications: polyneuropathy  Therapy: insulin 2011-2014; no med since then.   DKA: never.  Severe hypoglycemia: never.  Pancreatitis: never Other: he has not recently required medication.   Interval history: no cbg record, but states cbg's are well-controlled.  He denies hypoglycemia.  Pt also returns for f/u of hyperthyroidism (dx'ed 2000; he chose thionamide rx; he was off rx from 2005-2007, but it recurred, so he has been on tapazole since then; he has never had thyroid imaging).  he denies fever. Past Medical History:  Diagnosis Date  . Cataract   . Chronic kidney disease    left upper pole stone  . Diabetes mellitus without complication ([place])   . HTN (hypertension)   . Hyperthyroidism     Past Surgical History:  Procedure Laterality Date  . HERNIA REPAIR    . None Reported    . TONSILLECTOMY    . VASECTOMY      Social History   Social History  . Marital status: Married    Spouse name: N/A  . Number of children: N/A  . Years of education: N/A   Occupational History  . Not on file.   Social History Main Topics  . Smoking status: Never Smoker  . Smokeless tobacco: Current User    Types: Chew  . Alcohol use No  . Drug use: No  . Sexual activity: Not on file   Other Topics Concern  . Not on file   Social History Narrative   Works as Chief Financial Officer   Married    Current Outpatient Prescriptions on File Prior to Visit  Medication Sig Dispense Refill  . acetaminophen (TYLENOL) 500 MG tablet Take 500 mg by mouth every 6 (six) hours as needed for moderate pain.    Marland Kitchen aspirin 81 MG tablet Take 81 mg by mouth every morning.     Marland Kitchen atorvastatin (LIPITOR) 10 MG  tablet Take 1 tablet (10 mg total) by mouth daily. 90 tablet 3  . Bisacodyl (LAXATIVE PO) Take 1 tablet by mouth daily as needed (constipation).    Marland Kitchen glucose blood (ONE TOUCH ULTRA TEST) test strip 1 each by Other route daily. And lancets 1/day 100 each 2  . lisinopril-hydrochlorothiazide (PRINZIDE,ZESTORETIC) 20-25 MG tablet TAKE 1 TABLET BY MOUTH DAILY. 30 tablet 4  . methimazole (TAPAZOLE) 10 MG tablet TAKE 1 TABLET EVERYDAY 120 tablet 0  . Multiple Vitamin (MULTIVITAMIN) tablet Take 1 tablet by mouth every morning.     . sildenafil (REVATIO) 20 MG tablet 2-5 pills as needed for ED symptoms    . tamsulosin (FLOMAX) 0.4 MG CAPS capsule Take 1 capsule (0.4 mg total) by mouth daily. 30 capsule 0   No current facility-administered medications on file prior to visit.     No Known Allergies  Family History  Problem Relation Age of Onset  . Throat cancer Mother   . Other Son     Committed Suicide  . Colon cancer Neg Hx     BP 122/60   Pulse 64   Ht 5\' 7"  (1.702 m)   Wt 160 lb (72.6 kg)   SpO2 97%   BMI 25.06 kg/m    Review  of Systems Denies fever    Objective:   Physical Exam VITAL SIGNS:  See vs page GENERAL: no distress Pulses: dorsalis pedis intact bilat.   MSK: no deformity of the feet CV: no leg edema Skin:  no ulcer on the feet.  normal color and temp on the feet. Neuro: sensation is intact to touch on the feet.     Lab Results  Component Value Date   HGBA1C 6.1 03/26/2016   Lab Results  Component Value Date   TSH 2.13 03/26/2016      Assessment & Plan:  Hyperthyroidism: well-controlled Type 1 DM, in remission: well-controlled Patient is advised the following: Patient Instructions  blood tests are requested for you today.  We'll let you know about the results. Please come back for a regular physical appointment in 4 months.

## 2016-03-26 ENCOUNTER — Ambulatory Visit (INDEPENDENT_AMBULATORY_CARE_PROVIDER_SITE_OTHER): Payer: 59 | Admitting: Endocrinology

## 2016-03-26 ENCOUNTER — Encounter: Payer: Self-pay | Admitting: Endocrinology

## 2016-03-26 VITALS — BP 122/60 | HR 64 | Ht 67.0 in | Wt 160.0 lb

## 2016-03-26 DIAGNOSIS — E119 Type 2 diabetes mellitus without complications: Secondary | ICD-10-CM

## 2016-03-26 DIAGNOSIS — Z23 Encounter for immunization: Secondary | ICD-10-CM | POA: Diagnosis not present

## 2016-03-26 DIAGNOSIS — E0580 Other thyrotoxicosis without thyrotoxic crisis or storm: Secondary | ICD-10-CM

## 2016-03-26 LAB — TSH: TSH: 2.13 u[IU]/mL (ref 0.35–4.50)

## 2016-03-26 LAB — HEMOGLOBIN A1C: Hgb A1c MFr Bld: 6.1 % (ref 4.6–6.5)

## 2016-03-26 NOTE — Patient Instructions (Addendum)
blood tests are requested for you today.  We'll let you know about the results. Please come back for a regular physical appointment in 4 months.

## 2016-03-30 ENCOUNTER — Other Ambulatory Visit: Payer: Self-pay | Admitting: Endocrinology

## 2016-07-24 ENCOUNTER — Encounter: Payer: 59 | Admitting: Endocrinology

## 2016-08-14 ENCOUNTER — Other Ambulatory Visit: Payer: Self-pay

## 2016-08-14 MED ORDER — LISINOPRIL-HYDROCHLOROTHIAZIDE 20-25 MG PO TABS: 1.0000 | ORAL_TABLET | Freq: Every day | ORAL | 2 refills | Status: DC

## 2016-08-14 MED ORDER — METHIMAZOLE 10 MG PO TABS: ORAL_TABLET | ORAL | 1 refills | Status: DC

## 2016-08-19 ENCOUNTER — Encounter: Payer: Self-pay | Admitting: Endocrinology

## 2016-08-19 ENCOUNTER — Ambulatory Visit (INDEPENDENT_AMBULATORY_CARE_PROVIDER_SITE_OTHER): Payer: 59 | Admitting: Endocrinology

## 2016-08-19 VITALS — BP 132/84 | HR 83 | Ht 67.0 in | Wt 167.0 lb

## 2016-08-19 DIAGNOSIS — E119 Type 2 diabetes mellitus without complications: Secondary | ICD-10-CM | POA: Diagnosis not present

## 2016-08-19 DIAGNOSIS — E0580 Other thyrotoxicosis without thyrotoxic crisis or storm: Secondary | ICD-10-CM

## 2016-08-19 DIAGNOSIS — Z23 Encounter for immunization: Secondary | ICD-10-CM

## 2016-08-19 DIAGNOSIS — Z0000 Encounter for general adult medical examination without abnormal findings: Secondary | ICD-10-CM

## 2016-08-19 DIAGNOSIS — E7800 Pure hypercholesterolemia, unspecified: Secondary | ICD-10-CM | POA: Diagnosis not present

## 2016-08-19 LAB — LIPID PANEL
CHOLESTEROL: 172 mg/dL (ref 0–200)
HDL: 43.8 mg/dL (ref >39.00)
LDL Cholesterol: 98 mg/dL (ref 0–99)
NonHDL: 128.41
TRIGLYCERIDES: 150 mg/dL — AB (ref 0.0–149.0)
Total CHOL/HDL Ratio: 4
VLDL: 30 mg/dL (ref 0.0–40.0)

## 2016-08-19 LAB — POCT GLYCOSYLATED HEMOGLOBIN (HGB A1C): HEMOGLOBIN A1C: 5.9

## 2016-08-19 LAB — HEMOGLOBIN A1C: Hgb A1c MFr Bld: 6.3 % (ref 4.6–6.5)

## 2016-08-19 LAB — TSH: TSH: 3.31 u[IU]/mL (ref 0.35–4.50)

## 2016-08-19 NOTE — Patient Instructions (Signed)
Please consider these measures for your health:  minimize alcohol.  Do not use tobacco products.  Have a colonoscopy at least every 10 years from age 60. Keep firearms safely stored.  Always use seat belts.  have working smoke alarms in your home.  See an eye doctor and dentist regularly.  Never drive under the influence of alcohol or drugs (including prescription drugs).  Those with fair skin should take precautions against the sun, and should carefully examine their skin once per month, for any new or changed moles. blood tests are requested for you today.  We'll let you know about the results.  Please come back for a follow-up appointment in 6 months.    

## 2016-08-19 NOTE — Progress Notes (Signed)
Subjective:    Patient ID: Andrew Morrow, male    DOB: 1955-09-13, 61 y.o.   MRN: [id number]  HPI Pt is here for regular wellness examination, and is feeling pretty well in general, and says chronic med probs are stable, except as noted below Past Medical History:  Diagnosis Date  . Cataract   . Chronic kidney disease    left upper pole stone  . Diabetes mellitus without complication ([place])   . HTN (hypertension)   . Hyperthyroidism     Past Surgical History:  Procedure Laterality Date  . HERNIA REPAIR    . None Reported    . TONSILLECTOMY    . VASECTOMY      Social History   Social History  . Marital status: Married    Spouse name: N/A  . Number of children: N/A  . Years of education: N/A   Occupational History  . Not on file.   Social History Main Topics  . Smoking status: Never Smoker  . Smokeless tobacco: Current User    Types: Chew  . Alcohol use No  . Drug use: No  . Sexual activity: Not on file   Other Topics Concern  . Not on file   Social History Narrative   Works as Chief Financial Officer   Married    Current Outpatient Prescriptions on File Prior to Visit  Medication Sig Dispense Refill  . acetaminophen (TYLENOL) 500 MG tablet Take 500 mg by mouth every 6 (six) hours as needed for moderate pain.    Marland Kitchen aspirin 81 MG tablet Take 81 mg by mouth every morning.     Marland Kitchen atorvastatin (LIPITOR) 10 MG tablet Take 1 tablet (10 mg total) by mouth daily. 90 tablet 3  . Bisacodyl (LAXATIVE PO) Take 1 tablet by mouth daily as needed (constipation).    Marland Kitchen glucose blood (ONE TOUCH ULTRA TEST) test strip 1 each by Other route daily. And lancets 1/day 100 each 2  . lisinopril-hydrochlorothiazide (PRINZIDE,ZESTORETIC) 20-25 MG tablet Take 1 tablet by mouth daily. 90 tablet 2  . methimazole (TAPAZOLE) 10 MG tablet TAKE 1 TABLET EVERYDAY 90 tablet 1  . Multiple Vitamin (MULTIVITAMIN) tablet Take 1 tablet by mouth every morning.     . sildenafil (REVATIO) 20 MG tablet  2-5 pills as needed for ED symptoms    . tamsulosin (FLOMAX) 0.4 MG CAPS capsule Take 1 capsule (0.4 mg total) by mouth daily. 30 capsule 0   No current facility-administered medications on file prior to visit.     No Known Allergies  Family History  Problem Relation Age of Onset  . Throat cancer Mother   . Other Son     Committed Suicide  . Colon cancer Neg Hx     BP 132/84   Pulse 83   Ht 5\' 7"  (1.702 m)   Wt 167 lb (75.8 kg)   SpO2 98%   BMI 26.16 kg/m     Review of Systems Constitutional: Negative for fever and unexpected weight change.  HENT: Negative for hearing loss.   Eyes: Negative for visual disturbance.  Respiratory: Negative for shortness of breath.   Cardiovascular: Negative for chest pain.  Gastrointestinal: Negative for anal bleeding.  Endocrine: Negative for cold intolerance.  Genitourinary: Negative for hematuria and difficulty urinating.  Musculoskeletal: Negative for back pain.  Skin: Negative for rash.  Allergic/Immunologic: Negative for environmental allergies.  Neurological: Negative for syncope and numbness.  Hematological: Does not bruise/bleed easily.  Psychiatric/Behavioral: Negative for dysphoric mood.  Objective:   Physical Exam VS: see vs page GEN: no distress HEAD: head: no deformity eyes: no periorbital swelling, no proptosis external nose and ears are normal mouth: no lesion seen NECK: supple, thyroid is not enlarged.  CHEST WALL: no deformity. LUNGS: clear to auscultation.  BREASTS:  No gynecomastia. CV: reg rate and rhythm, no murmur.  ABD: abdomen is soft, nontender.  no hepatosplenomegaly.  not distended.  no hernia.  GENITALIA/RECTAL/PROSTATE: sees urology.   MUSCULOSKELETAL: muscle bulk and strength are grossly normal.  no obvious joint swelling.  gait is normal and steady.   EXTEMITIES: no deformity.  no ulcer on the feet.  feet are of normal color and temp.  no edema.  PULSES: dorsalis pedis intact bilat.  no  carotid bruit NEURO:  cn 2-12 grossly intact.   readily moves all 4's.  sensation is intact to touch on the feet.   SKIN:  Normal texture and temperature.  No rash or suspicious lesion is visible.   NODES:  None palpable at the neck.   PSYCH: alert, well-oriented.  Does not appear anxious nor depressed.    I personally reviewed electrocardiogram tracing (today):  Indication: dyslipidemia Impression: NSR.  No MI.  No hypertrophy. Compared to 2016: no change      Assessment & Plan:  Wellness visit today, with problems stable. Patient Instructions  Please consider these measures for your health:  minimize alcohol.  Do not use tobacco products.  Have a colonoscopy at least every 10 years from age 50.  Keep firearms safely stored.  Always use seat belts.  have working smoke alarms in your home.  See an eye doctor and dentist regularly.  Never drive under the influence of alcohol or drugs (including prescription drugs).  Those with fair skin should take precautions against the sun, and should carefully examine their skin once per month, for any new or changed moles. blood tests are requested for you today.  We'll let you know about the results. Please come back for a follow-up appointment in 6 months.

## 2016-08-20 ENCOUNTER — Telehealth: Payer: Self-pay

## 2016-08-20 LAB — HEPATITIS C ANTIBODY: HCV Ab: NEGATIVE

## 2016-08-20 LAB — HIV ANTIBODY (ROUTINE TESTING W REFLEX): HIV: NONREACTIVE

## 2016-08-20 NOTE — Telephone Encounter (Signed)
-----   Message from Renato Shin, MD sent at 08/19/2016  6:23 PM EDT ----- please call patient: normal

## 2016-08-20 NOTE — Telephone Encounter (Signed)
I contacted the patient and advised of results via voicemail. Requested a call back if the patient would like to discuss further. 

## 2016-09-29 ENCOUNTER — Other Ambulatory Visit: Payer: Self-pay | Admitting: Endocrinology

## 2016-10-07 ENCOUNTER — Other Ambulatory Visit: Payer: Self-pay | Admitting: Endocrinology

## 2017-02-18 ENCOUNTER — Ambulatory Visit: Payer: 59 | Admitting: Endocrinology

## 2017-02-18 DIAGNOSIS — Z0289 Encounter for other administrative examinations: Secondary | ICD-10-CM

## 2017-04-05 ENCOUNTER — Other Ambulatory Visit: Payer: Self-pay | Admitting: Endocrinology

## 2017-04-05 NOTE — Telephone Encounter (Signed)
Please refill x 1 Ov is due  

## 2017-04-06 ENCOUNTER — Other Ambulatory Visit: Payer: Self-pay

## 2017-04-06 MED ORDER — METHIMAZOLE 10 MG PO TABS: ORAL_TABLET | ORAL | 0 refills | Status: DC

## 2017-04-07 ENCOUNTER — Other Ambulatory Visit: Payer: Self-pay

## 2017-04-07 MED ORDER — METHIMAZOLE 10 MG PO TABS: ORAL_TABLET | ORAL | 1 refills | Status: DC

## 2017-07-06 ENCOUNTER — Encounter: Payer: Self-pay | Admitting: Endocrinology

## 2017-07-06 ENCOUNTER — Ambulatory Visit: Payer: 59 | Admitting: Endocrinology

## 2017-07-06 VITALS — BP 128/74 | HR 84 | Ht 67.0 in | Wt 174.4 lb

## 2017-07-06 DIAGNOSIS — E0580 Other thyrotoxicosis without thyrotoxic crisis or storm: Secondary | ICD-10-CM | POA: Diagnosis not present

## 2017-07-06 DIAGNOSIS — Z125 Encounter for screening for malignant neoplasm of prostate: Secondary | ICD-10-CM | POA: Diagnosis not present

## 2017-07-06 DIAGNOSIS — Z129 Encounter for screening for malignant neoplasm, site unspecified: Secondary | ICD-10-CM | POA: Diagnosis not present

## 2017-07-06 DIAGNOSIS — E119 Type 2 diabetes mellitus without complications: Secondary | ICD-10-CM

## 2017-07-06 LAB — TSH: TSH: 2.88 u[IU]/mL (ref 0.35–4.50)

## 2017-07-06 LAB — HEMOGLOBIN A1C: Hgb A1c MFr Bld: 6.6 % — ABNORMAL HIGH (ref 4.6–6.5)

## 2017-07-06 LAB — PSA: PSA: 1.07 ng/mL (ref 0.10–4.00)

## 2017-07-06 LAB — T4, FREE: Free T4: 0.65 ng/dL (ref 0.60–1.60)

## 2017-07-06 MED ORDER — SILDENAFIL CITRATE 100 MG PO TABS: 100.0000 mg | ORAL_TABLET | Freq: Every day | ORAL | 11 refills | Status: DC | PRN

## 2017-07-06 NOTE — Patient Instructions (Addendum)
blood tests are requested for you today.  We'll let you know about the results.  Please continue the same medications.     Please come back for a regular physical appointment in 6 months.

## 2017-07-06 NOTE — Progress Notes (Signed)
Subjective:    Patient ID: Andrew Morrow, male    DOB: [birth year], 62 y.o.   MRN: [id number]  HPI The state of at least three ongoing medical problems is addressed today, with interval history of each noted here:  Pt returns for f/u of diabetes mellitus:  DM type: 1, in remission Dx'ed: 3149 Complications: polyneuropathy.  Therapy: insulin 2011-2014; no med since then.   DKA: never.  Severe hypoglycemia: never.  Pancreatitis: never.  Other: he has not recently required medication.   Interval history: no cbg record, but states cbg's are well-controlled.   Pt also returns for f/u of hyperthyroidism (dx'ed 2000; he chose thionamide rx; he was off rx from 2005-2007, but it recurred, so he has been on tapazole since then; he has never had thyroid imaging).   He brings a record of his BP which I have reviewed today.  It varies from 110-140/70-85.   Past Medical History:  Diagnosis Date  . Cataract   . Chronic kidney disease    left upper pole stone  . Diabetes mellitus without complication ([place])   . HTN (hypertension)   . Hyperthyroidism     Past Surgical History:  Procedure Laterality Date  . HERNIA REPAIR    . None Reported    . TONSILLECTOMY    . VASECTOMY      Social History   Socioeconomic History  . Marital status: Married    Spouse name: Not on file  . Number of children: Not on file  . Years of education: Not on file  . Highest education level: Not on file  Social Needs  . Financial resource strain: Not on file  . Food insecurity - worry: Not on file  . Food insecurity - inability: Not on file  . Transportation needs - medical: Not on file  . Transportation needs - non-medical: Not on file  Occupational History  . Not on file  Tobacco Use  . Smoking status: Never Smoker  . Smokeless tobacco: Current User    Types: Chew  Substance and Sexual Activity  . Alcohol use: No    Alcohol/week: 0.0 oz  . Drug use: No  . Sexual activity: Not on file  Other  Topics Concern  . Not on file  Social History Narrative   Works as Chief Financial Officer   Married    Current Outpatient Medications on File Prior to Visit  Medication Sig Dispense Refill  . acetaminophen (TYLENOL) 500 MG tablet Take 500 mg by mouth every 6 (six) hours as needed for moderate pain.    Marland Kitchen aspirin 81 MG tablet Take 81 mg by mouth every morning.     Marland Kitchen atorvastatin (LIPITOR) 10 MG tablet TAKE 1 TABLET BY MOUTH EVERY DAY 90 tablet 2  . Bisacodyl (LAXATIVE PO) Take 1 tablet by mouth daily as needed (constipation).    Marland Kitchen lisinopril-hydrochlorothiazide (PRINZIDE,ZESTORETIC) 20-25 MG tablet Take 1 tablet by mouth daily. 90 tablet 2  . methimazole (TAPAZOLE) 10 MG tablet TAKE 1 TABLET EVERYDAY 90 tablet 1  . Multiple Vitamin (MULTIVITAMIN) tablet Take 1 tablet by mouth every morning.     . ONE TOUCH ULTRA TEST test strip TEST BLOOD SUGAR DAILY 100 each 2  . ONE TOUCH ULTRA TEST test strip TEST BLOOD SUGAR DAILY 100 each 2  . tamsulosin (FLOMAX) 0.4 MG CAPS capsule Take 1 capsule (0.4 mg total) by mouth daily. 30 capsule 0   No current facility-administered medications on file prior to visit.  No Known Allergies  Family History  Problem Relation Age of Onset  . Throat cancer Mother   . Other Son        Committed Suicide  . Colon cancer Neg Hx     BP 128/74   Pulse 84   Ht 5\' 7"  (1.702 m)   Wt 174 lb 6.4 oz (79.1 kg)   SpO2 98%   BMI 27.31 kg/m   Review of Systems Denies fever. He says viagra work well.      Objective:   Physical Exam VITAL SIGNS:  See vs page GENERAL: no distress NECK: There is no palpable thyroid enlargement.  No thyroid nodule is palpable.  No palpable lymphadenopathy at the anterior neck. Pulses: dorsalis pedis intact bilat.   MSK: no deformity of the feet CV: no leg edema Skin:  no ulcer on the feet.  normal color and temp on the feet. Neuro: sensation is intact to touch on the feet.     Lab Results  Component Value Date   HGBA1C  6.6 (H) 07/06/2017   Lab Results  Component Value Date   TSH 2.88 07/06/2017      Assessment & Plan:  Type 2 DM: worse.  We'll have to follow this, as he may need to resume rx Hyperthyroidism: well-controlled.  Please continue the same medication. ED: well-controlled.  We changed to 100 mg viagra, for cost.   Patient Instructions  blood tests are requested for you today.  We'll let you know about the results.  Please continue the same medications.     Please come back for a regular physical appointment in 6 months.

## 2017-07-28 ENCOUNTER — Other Ambulatory Visit: Payer: Self-pay | Admitting: Endocrinology

## 2017-09-03 ENCOUNTER — Encounter: Payer: Self-pay | Admitting: *Deleted

## 2017-09-28 ENCOUNTER — Encounter: Payer: Self-pay | Admitting: Internal Medicine

## 2017-11-01 ENCOUNTER — Other Ambulatory Visit: Payer: Self-pay | Admitting: Endocrinology

## 2017-11-08 ENCOUNTER — Other Ambulatory Visit: Payer: Self-pay | Admitting: Endocrinology

## 2018-01-04 ENCOUNTER — Encounter: Payer: Self-pay | Admitting: Endocrinology

## 2018-01-04 ENCOUNTER — Ambulatory Visit: Payer: 59 | Admitting: Endocrinology

## 2018-01-04 VITALS — BP 142/82 | HR 69 | Temp 97.8°F | Resp 16 | Wt 174.0 lb

## 2018-01-04 DIAGNOSIS — E0580 Other thyrotoxicosis without thyrotoxic crisis or storm: Secondary | ICD-10-CM | POA: Diagnosis not present

## 2018-01-04 DIAGNOSIS — E119 Type 2 diabetes mellitus without complications: Secondary | ICD-10-CM | POA: Diagnosis not present

## 2018-01-04 LAB — POCT GLYCOSYLATED HEMOGLOBIN (HGB A1C): HEMOGLOBIN A1C: 6.2 % — AB (ref 4.0–5.6)

## 2018-01-04 NOTE — Patient Instructions (Addendum)
Please continue the same medications.    Please come back for a regular physical appointment in 2 months.

## 2018-01-04 NOTE — Progress Notes (Signed)
Subjective:    Patient ID: Andrew Morrow, male    DOB: 1956-03-16, 62 y.o.   MRN: [id number]  HPI Pt returns for f/u of diabetes mellitus:  DM type: 1, in remission Dx'ed: 8756 Complications: polyneuropathy.  Therapy: insulin 2011-2014; no med since then.   DKA: never.  Severe hypoglycemia: never.  Pancreatitis: never.  Other: he has not recently required medication.   Interval history: He brings a record of his cbg's which I have reviewed today.  All are approx 100.   Pt also has hyperthyroidism (dx'ed 2000; he chose thionamide rx; he was off rx from 2005-2007, but it recurred, so he has been on tapazole since then; he has never had thyroid imaging).   Past Medical History:  Diagnosis Date  . Cataract   . Chronic kidney disease    left upper pole stone  . Diabetes mellitus without complication ([place])   . HTN (hypertension)   . Hyperthyroidism     Past Surgical History:  Procedure Laterality Date  . HERNIA REPAIR    . None Reported    . TONSILLECTOMY    . VASECTOMY      Social History   Socioeconomic History  . Marital status: Married    Spouse name: Not on file  . Number of children: Not on file  . Years of education: Not on file  . Highest education level: Not on file  Occupational History  . Not on file  Social Needs  . Financial resource strain: Not on file  . Food insecurity:    Worry: Not on file    Inability: Not on file  . Transportation needs:    Medical: Not on file    Non-medical: Not on file  Tobacco Use  . Smoking status: Never Smoker  . Smokeless tobacco: Current User    Types: Chew  Substance and Sexual Activity  . Alcohol use: No    Alcohol/week: 0.0 standard drinks  . Drug use: No  . Sexual activity: Not on file  Lifestyle  . Physical activity:    Days per week: Not on file    Minutes per session: Not on file  . Stress: Not on file  Relationships  . Social connections:    Talks on phone: Not on file    Gets together: Not on file     Attends religious service: Not on file    Active member of club or organization: Not on file    Attends meetings of clubs or organizations: Not on file    Relationship status: Not on file  . Intimate partner violence:    Fear of current or ex partner: Not on file    Emotionally abused: Not on file    Physically abused: Not on file    Forced sexual activity: Not on file  Other Topics Concern  . Not on file  Social History Narrative   Works as Chief Financial Officer   Married    Current Outpatient Medications on File Prior to Visit  Medication Sig Dispense Refill  . acetaminophen (TYLENOL) 500 MG tablet Take 500 mg by mouth every 6 (six) hours as needed for moderate pain.    Marland Kitchen aspirin 81 MG tablet Take 81 mg by mouth every morning.     Marland Kitchen atorvastatin (LIPITOR) 10 MG tablet TAKE 1 TABLET BY MOUTH EVERY DAY 90 tablet 2  . Bisacodyl (LAXATIVE PO) Take 1 tablet by mouth daily as needed (constipation).    Marland Kitchen lisinopril-hydrochlorothiazide (PRINZIDE,ZESTORETIC) 20-25 MG tablet  TAKE 1 TABLET BY MOUTH DAILY. 90 tablet 2  . methimazole (TAPAZOLE) 10 MG tablet TAKE 1 TABLET EVERYDAY 90 tablet 1  . Multiple Vitamin (MULTIVITAMIN) tablet Take 1 tablet by mouth every morning.     . ONE TOUCH ULTRA TEST test strip TEST BLOOD SUGAR DAILY 100 each 2  . sildenafil (VIAGRA) 100 MG tablet Take 1 tablet (100 mg total) by mouth daily as needed for erectile dysfunction. 10 tablet 11  . tamsulosin (FLOMAX) 0.4 MG CAPS capsule Take 1 capsule (0.4 mg total) by mouth daily. 30 capsule 0   No current facility-administered medications on file prior to visit.     No Known Allergies  Family History  Problem Relation Age of Onset  . Throat cancer Mother   . Other Son        Committed Suicide  . Colon cancer Neg Hx     BP (!) 142/82   Pulse 69   Temp 97.8 F (36.6 C) (Oral)   Resp 16   Wt 174 lb (78.9 kg)   SpO2 96%   BMI 27.25 kg/m    Review of Systems Denies fever.      Objective:    Physical Exam VITAL SIGNS:  See vs page.   GENERAL: no distress.  Pulses: dorsalis pedis intact bilat.   MSK: no deformity of the feet.  CV: no leg edema.   Skin:  no ulcer on the feet.  normal color and temp on the feet.   Neuro: sensation is intact to touch on the feet.     Lab Results  Component Value Date   HGBA1C 6.2 (A) 01/04/2018       Assessment & Plan:  Type 1 DM, in remission.  We'll follow Hyperthyroidism: we discussed.  As he has been feeling well, we'll hold off on recheck until upcoming CPX. HTN: recheck next time  Patient Instructions  Please continue the same medications.    Please come back for a regular physical appointment in 2 months.

## 2018-01-18 ENCOUNTER — Telehealth: Payer: Self-pay | Admitting: Endocrinology

## 2018-01-19 NOTE — Telephone Encounter (Signed)
error 

## 2018-03-03 ENCOUNTER — Encounter: Payer: Self-pay | Admitting: Internal Medicine

## 2018-03-08 ENCOUNTER — Ambulatory Visit: Payer: 59 | Admitting: Endocrinology

## 2018-03-08 VITALS — BP 132/78 | HR 71 | Ht 67.0 in | Wt 170.2 lb

## 2018-03-08 DIAGNOSIS — E0580 Other thyrotoxicosis without thyrotoxic crisis or storm: Secondary | ICD-10-CM

## 2018-03-08 DIAGNOSIS — Z23 Encounter for immunization: Secondary | ICD-10-CM

## 2018-03-08 DIAGNOSIS — Z125 Encounter for screening for malignant neoplasm of prostate: Secondary | ICD-10-CM | POA: Diagnosis not present

## 2018-03-08 DIAGNOSIS — Z0000 Encounter for general adult medical examination without abnormal findings: Secondary | ICD-10-CM

## 2018-03-08 DIAGNOSIS — E119 Type 2 diabetes mellitus without complications: Secondary | ICD-10-CM | POA: Diagnosis not present

## 2018-03-08 DIAGNOSIS — I10 Essential (primary) hypertension: Secondary | ICD-10-CM | POA: Diagnosis not present

## 2018-03-08 LAB — HEPATIC FUNCTION PANEL
ALK PHOS: 96 U/L (ref 39–117)
ALT: 23 U/L (ref 0–53)
AST: 22 U/L (ref 0–37)
Albumin: 4.7 g/dL (ref 3.5–5.2)
BILIRUBIN DIRECT: 0.1 mg/dL (ref 0.0–0.3)
Total Bilirubin: 0.7 mg/dL (ref 0.2–1.2)
Total Protein: 7.2 g/dL (ref 6.0–8.3)

## 2018-03-08 LAB — CBC WITH DIFFERENTIAL/PLATELET
BASOS ABS: 0 10*3/uL (ref 0.0–0.1)
BASOS PCT: 0.5 % (ref 0.0–3.0)
EOS ABS: 0.1 10*3/uL (ref 0.0–0.7)
Eosinophils Relative: 1.4 % (ref 0.0–5.0)
HEMATOCRIT: 45.3 % (ref 39.0–52.0)
HEMOGLOBIN: 15.4 g/dL (ref 13.0–17.0)
LYMPHS PCT: 23.1 % (ref 12.0–46.0)
Lymphs Abs: 1.8 10*3/uL (ref 0.7–4.0)
MCHC: 34.1 g/dL (ref 30.0–36.0)
MCV: 90 fl (ref 78.0–100.0)
Monocytes Absolute: 0.5 10*3/uL (ref 0.1–1.0)
Monocytes Relative: 7.1 % (ref 3.0–12.0)
Neutro Abs: 5.2 10*3/uL (ref 1.4–7.7)
Neutrophils Relative %: 67.9 % (ref 43.0–77.0)
Platelets: 218 10*3/uL (ref 150.0–400.0)
RBC: 5.04 Mil/uL (ref 4.22–5.81)
RDW: 13 % (ref 11.5–15.5)
WBC: 7.6 10*3/uL (ref 4.0–10.5)

## 2018-03-08 LAB — URINALYSIS, ROUTINE W REFLEX MICROSCOPIC
Bilirubin Urine: NEGATIVE
Hgb urine dipstick: NEGATIVE
KETONES UR: NEGATIVE
Leukocytes, UA: NEGATIVE
Nitrite: NEGATIVE
PH: 7 (ref 5.0–8.0)
SPECIFIC GRAVITY, URINE: 1.01 (ref 1.000–1.030)
Total Protein, Urine: NEGATIVE
UROBILINOGEN UA: 1 (ref 0.0–1.0)
Urine Glucose: NEGATIVE

## 2018-03-08 LAB — BASIC METABOLIC PANEL
BUN: 14 mg/dL (ref 6–23)
CO2: 30 mEq/L (ref 19–32)
Calcium: 9.8 mg/dL (ref 8.4–10.5)
Chloride: 96 mEq/L (ref 96–112)
Creatinine, Ser: 1.13 mg/dL (ref 0.40–1.50)
GFR: 70.01 mL/min (ref >60.00)
GLUCOSE: 114 mg/dL — AB (ref 70–99)
Potassium: 4.2 mEq/L (ref 3.5–5.1)
Sodium: 137 mEq/L (ref 135–145)

## 2018-03-08 LAB — POCT GLYCOSYLATED HEMOGLOBIN (HGB A1C): Hemoglobin A1C: 6 % — AB (ref 4.0–5.6)

## 2018-03-08 LAB — LIPID PANEL
CHOL/HDL RATIO: 4
CHOLESTEROL: 157 mg/dL (ref 0–200)
HDL: 42.1 mg/dL (ref >39.00)
LDL CALC: 77 mg/dL (ref 0–99)
NonHDL: 114.69
Triglycerides: 189 mg/dL — ABNORMAL HIGH (ref 0.0–149.0)
VLDL: 37.8 mg/dL (ref 0.0–40.0)

## 2018-03-08 LAB — TSH: TSH: 2.68 u[IU]/mL (ref 0.35–4.50)

## 2018-03-08 LAB — MICROALBUMIN / CREATININE URINE RATIO
Creatinine,U: 93.6 mg/dL
Microalb Creat Ratio: 0.7 mg/g (ref 0.0–30.0)

## 2018-03-08 LAB — PSA: PSA: 1.22 ng/mL (ref 0.10–4.00)

## 2018-03-08 LAB — T4, FREE: Free T4: 0.64 ng/dL (ref 0.60–1.60)

## 2018-03-08 NOTE — Progress Notes (Signed)
Subjective:    Patient ID: Andrew Morrow, male    DOB: 04/17/1956, 62 y.o.   MRN: [id number]  HPI Pt is here for regular wellness examination, and is feeling pretty well in general, and says chronic med probs are stable, except as noted below Past Medical History:  Diagnosis Date  . Cataract   . Chronic kidney disease    left upper pole stone  . Diabetes mellitus without complication ([place])   . HTN (hypertension)   . Hyperthyroidism     Past Surgical History:  Procedure Laterality Date  . HERNIA REPAIR    . None Reported    . TONSILLECTOMY    . VASECTOMY      Social History   Socioeconomic History  . Marital status: Married    Spouse name: Not on file  . Number of children: Not on file  . Years of education: Not on file  . Highest education level: Not on file  Occupational History  . Not on file  Social Needs  . Financial resource strain: Not on file  . Food insecurity:    Worry: Not on file    Inability: Not on file  . Transportation needs:    Medical: Not on file    Non-medical: Not on file  Tobacco Use  . Smoking status: Never Smoker  . Smokeless tobacco: Current User    Types: Chew  Substance and Sexual Activity  . Alcohol use: No    Alcohol/week: 0.0 standard drinks  . Drug use: No  . Sexual activity: Not on file  Lifestyle  . Physical activity:    Days per week: Not on file    Minutes per session: Not on file  . Stress: Not on file  Relationships  . Social connections:    Talks on phone: Not on file    Gets together: Not on file    Attends religious service: Not on file    Active member of club or organization: Not on file    Attends meetings of clubs or organizations: Not on file    Relationship status: Not on file  . Intimate partner violence:    Fear of current or ex partner: Not on file    Emotionally abused: Not on file    Physically abused: Not on file    Forced sexual activity: Not on file  Other Topics Concern  . Not on file    Social History Narrative   Works as Chief Financial Officer   Married    Current Outpatient Medications on File Prior to Visit  Medication Sig Dispense Refill  . acetaminophen (TYLENOL) 500 MG tablet Take 500 mg by mouth every 6 (six) hours as needed for moderate pain.    Marland Kitchen aspirin 81 MG tablet Take 81 mg by mouth every morning.     Marland Kitchen atorvastatin (LIPITOR) 10 MG tablet TAKE 1 TABLET BY MOUTH EVERY DAY 90 tablet 2  . Bisacodyl (LAXATIVE PO) Take 1 tablet by mouth daily as needed (constipation).    Marland Kitchen lisinopril-hydrochlorothiazide (PRINZIDE,ZESTORETIC) 20-25 MG tablet TAKE 1 TABLET BY MOUTH DAILY. 90 tablet 2  . methimazole (TAPAZOLE) 10 MG tablet TAKE 1 TABLET EVERYDAY 90 tablet 1  . Multiple Vitamin (MULTIVITAMIN) tablet Take 1 tablet by mouth every morning.     . ONE TOUCH ULTRA TEST test strip TEST BLOOD SUGAR DAILY 100 each 2  . sildenafil (VIAGRA) 100 MG tablet Take 1 tablet (100 mg total) by mouth daily as needed for erectile dysfunction. 10  tablet 11  . tamsulosin (FLOMAX) 0.4 MG CAPS capsule Take 1 capsule (0.4 mg total) by mouth daily. 30 capsule 0   No current facility-administered medications on file prior to visit.     No Known Allergies  Family History  Problem Relation Age of Onset  . Throat cancer Mother   . Other Son        Committed Suicide  . Colon cancer Neg Hx     BP 132/78 (BP Location: Left Arm, Patient Position: Sitting, Cuff Size: Normal)   Pulse 71   Ht 5\' 7"  (1.702 m)   Wt 170 lb 3.2 oz (77.2 kg)   SpO2 97%   BMI 26.66 kg/m     Review of Systems Denies fever, fatigue, visual loss, hearing loss, chest pain, sob, back pain, depression, cold intolerance, BRBPR, hematuria, syncope, numbness, allergy sxs, easy bruising, and rash.       Objective:   Physical Exam VS: see vs page GEN: no distress HEAD: head: no deformity eyes: no periorbital swelling, no proptosis external nose and ears are normal mouth: no lesion seen NECK: supple, thyroid is  not enlarged CHEST WALL: no deformity LUNGS: clear to auscultation CV: reg rate and rhythm, no murmur ABD: abdomen is soft, nontender.  no hepatosplenomegaly.  not distended.  no hernia MUSCULOSKELETAL: muscle bulk and strength are grossly normal.  no obvious joint swelling.  gait is normal and steady.  EXTEMITIES: no deformity.  no ulcer on the feet.  feet are of normal color and temp.  no edema.  PULSES: dorsalis pedis intact bilat.  no carotid bruit NEURO:  cn 2-12 grossly intact.   readily moves all 4's.  sensation is intact to touch on the feet SKIN:  Normal texture and temperature.  No rash or suspicious lesion is visible.   NODES:  None palpable at the neck PSYCH: alert, well-oriented.  Does not appear anxious nor depressed.  I personally reviewed electrocardiogram tracing (today): Indication: wellness Impression: NSR.  No MI.  No hypertrophy. Compared to 2018: no significant change.  Lab Results  Component Value Date   HGBA1C 6.0 (A) 03/08/2018        Assessment & Plan:  Wellness visit today, with problems stable, except as noted.  Patient Instructions  Please consider these measures for your health:  minimize alcohol.  Do not use tobacco products.  Have a colonoscopy at least every 10 years from age 23. Keep firearms safely stored.  Always use seat belts.  have working smoke alarms in your home.  See an eye doctor and dentist regularly.  Never drive under the influence of alcohol or drugs (including prescription drugs).  Those with fair skin should take precautions against the sun, and should carefully examine their skin once per month, for any new or changed moles. blood tests are requested for you today.  We'll let you know about the results.  Please come back for a follow-up appointment in 6 months.

## 2018-03-08 NOTE — Patient Instructions (Addendum)
Please consider these measures for your health:  minimize alcohol.  Do not use tobacco products.  Have a colonoscopy at least every 10 years from age 61. Keep firearms safely stored.  Always use seat belts.  have working smoke alarms in your home.  See an eye doctor and dentist regularly.  Never drive under the influence of alcohol or drugs (including prescription drugs).  Those with fair skin should take precautions against the sun, and should carefully examine their skin once per month, for any new or changed moles. blood tests are requested for you today.  We'll let you know about the results.  Please come back for a follow-up appointment in 6 months.

## 2018-03-30 ENCOUNTER — Ambulatory Visit (AMBULATORY_SURGERY_CENTER): Payer: Self-pay | Admitting: *Deleted

## 2018-03-30 ENCOUNTER — Encounter: Payer: Self-pay | Admitting: Internal Medicine

## 2018-03-30 VITALS — Ht 67.0 in | Wt 170.0 lb

## 2018-03-30 DIAGNOSIS — Z86010 Personal history of colonic polyps: Secondary | ICD-10-CM

## 2018-03-30 MED ORDER — NA SULFATE-K SULFATE-MG SULF 17.5-3.13-1.6 GM/177ML PO SOLN: 1.0000 | Freq: Once | ORAL | 0 refills | Status: AC

## 2018-03-30 NOTE — Progress Notes (Signed)
No egg or soy allergy known to patient  No issues with past sedation with any surgeries  or procedures, no intubation problems  No diet pills per patient No home 02 use per patient  No blood thinners per patient  Pt denies issues with constipation  - pt denies issues with constipation  No A fib or A flutter  EMMI video sent to pt's e mail  - pt declined  suprep $15 coupon to pt

## 2018-04-13 ENCOUNTER — Encounter: Payer: Self-pay | Admitting: Internal Medicine

## 2018-04-13 ENCOUNTER — Ambulatory Visit (AMBULATORY_SURGERY_CENTER): Payer: 59 | Admitting: Internal Medicine

## 2018-04-13 VITALS — BP 122/62 | HR 60 | Temp 98.2°F | Resp 16 | Ht 67.0 in | Wt 170.0 lb

## 2018-04-13 DIAGNOSIS — K635 Polyp of colon: Secondary | ICD-10-CM

## 2018-04-13 DIAGNOSIS — D120 Benign neoplasm of cecum: Secondary | ICD-10-CM

## 2018-04-13 DIAGNOSIS — Z86010 Personal history of colonic polyps: Secondary | ICD-10-CM | POA: Diagnosis present

## 2018-04-13 DIAGNOSIS — D122 Benign neoplasm of ascending colon: Secondary | ICD-10-CM

## 2018-04-13 MED ORDER — SODIUM CHLORIDE 0.9 % IV SOLN: 500.0000 mL | Freq: Once | INTRAVENOUS | Status: DC

## 2018-04-13 NOTE — Patient Instructions (Signed)
Handouts provided:  Polyps  YOU HAD AN ENDOSCOPIC PROCEDURE TODAY AT THE [facility]:   Refer to the procedure report that was given to you for any specific questions about what was found during the examination.  If the procedure report does not answer your questions, please call your gastroenterologist to clarify.  If you requested that your care partner not be given the details of your procedure findings, then the procedure report has been included in a sealed envelope for you to review at your convenience later.  YOU SHOULD EXPECT: Some feelings of bloating in the abdomen. Passage of more gas than usual.  Walking can help get rid of the air that was put into your GI tract during the procedure and reduce the bloating. If you had a lower endoscopy (such as a colonoscopy or flexible sigmoidoscopy) you may notice spotting of blood in your stool or on the toilet paper. If you underwent a bowel prep for your procedure, you may not have a normal bowel movement for a few days.  Please Note:  You might notice some irritation and congestion in your nose or some drainage.  This is from the oxygen used during your procedure.  There is no need for concern and it should clear up in a day or so.  SYMPTOMS TO REPORT IMMEDIATELY:   Following lower endoscopy (colonoscopy or flexible sigmoidoscopy):  Excessive amounts of blood in the stool  Significant tenderness or worsening of abdominal pains  Swelling of the abdomen that is new, acute  Fever of 100F or higher  For urgent or emergent issues, a gastroenterologist can be reached at any hour by calling [phone].   DIET:  We do recommend a small meal at first, but then you may proceed to your regular diet.  Drink plenty of fluids but you should avoid alcoholic beverages for 24 hours.  ACTIVITY:  You should plan to take it easy for the rest of today and you should NOT DRIVE or use heavy machinery until tomorrow (because of the sedation  medicines used during the test).    FOLLOW UP: Our staff will call the number listed on your records the next business day following your procedure to check on you and address any questions or concerns that you may have regarding the information given to you following your procedure. If we do not reach you, we will leave a message.  However, if you are feeling well and you are not experiencing any problems, there is no need to return our call.  We will assume that you have returned to your regular daily activities without incident.  If any biopsies were taken you will be contacted by phone or by letter within the next 1-3 weeks.  Please call us at [phone] if you have not heard about the biopsies in 3 weeks.    SIGNATURES/CONFIDENTIALITY: You and/or your care partner have signed paperwork which will be entered into your electronic medical record.  These signatures attest to the fact that that the information above on your After Visit Summary has been reviewed and is understood.  Full responsibility of the confidentiality of this discharge information lies with you and/or your care-partner.  

## 2018-04-13 NOTE — Op Note (Signed)
[facility] Patient Name: Andrew Morrow Procedure Date: 04/13/2018 8:56 AM MRN: [id number] Endoscopist: Jerene Bears , MD Age: 62 Referring MD:  Date of Birth: Sep 21, 1955 Gender: Male Account #: [id number] Procedure:                Colonoscopy Indications:              Surveillance: Personal history of adenomatous                            polyps on last colonoscopy 3 years ago Medicines:                Monitored Anesthesia Care Procedure:                Pre-Anesthesia Assessment:                           - Prior to the procedure, a History and Physical                            was performed, and patient medications and                            allergies were reviewed. The patient's tolerance of                            previous anesthesia was also reviewed. The risks                            and benefits of the procedure and the sedation                            options and risks were discussed with the patient.                            All questions were answered, and informed consent                            was obtained. Prior Anticoagulants: The patient has                            taken no previous anticoagulant or antiplatelet                            agents. ASA Grade Assessment: II - A patient with                            mild systemic disease. After reviewing the risks                            and benefits, the patient was deemed in                            satisfactory condition to undergo the procedure.  After obtaining informed consent, the colonoscope                            was passed under direct vision. Throughout the                            procedure, the patient's blood pressure, pulse, and                            oxygen saturations were monitored continuously. The                            Colonoscope was introduced through the anus and                            advanced to the cecum,  identified by appendiceal                            orifice and ileocecal valve. The colonoscopy was                            performed without difficulty. The patient tolerated                            the procedure well. The quality of the bowel                            preparation was good. The ileocecal valve,                            appendiceal orifice, and rectum were photographed. Scope In: 9:06:02 AM Scope Out: 9:21:47 AM Scope Withdrawal Time: 0 hours 13 minutes 10 seconds  Total Procedure Duration: 0 hours 15 minutes 45 seconds  Findings:                 The digital rectal exam was normal.                           Two sessile polyps were found in the cecum. The                            polyps were 2 to 3 mm in size. These polyps were                            removed with a cold snare. Resection and retrieval                            were complete.                           A 4 mm polyp was found in the ascending colon. The                            polyp was sessile. The  polyp was removed with a                            cold snare. Resection and retrieval were complete.                           The exam was otherwise without abnormality on                            direct and retroflexion views. Complications:            No immediate complications. Estimated Blood Loss:     Estimated blood loss was minimal. Impression:               - Two 2 to 3 mm polyps in the cecum, removed with a                            cold snare. Resected and retrieved.                           - One 4 mm polyp in the ascending colon, removed                            with a cold snare. Resected and retrieved.                           - The examination was otherwise normal on direct                            and retroflexion views. Recommendation:           - Patient has a contact number available for                            emergencies. The signs and symptoms of potential                             delayed complications were discussed with the                            patient. Return to normal activities tomorrow.                            Written discharge instructions were provided to the                            patient.                           - Resume previous diet.                           - Continue present medications.                           - Await pathology results.                           -  Repeat colonoscopy is recommended for                            surveillance. The colonoscopy date will be                            determined after pathology results from today's                            exam become available for review. Jerene Bears, MD 04/13/2018 9:24:44 AM This report has been signed electronically.

## 2018-04-13 NOTE — Progress Notes (Signed)
Called to room to assist during endoscopic procedure.  Patient ID and intended procedure confirmed with present staff. Received instructions for my participation in the procedure from the performing physician.  

## 2018-04-13 NOTE — Progress Notes (Signed)
Report to PACU, RN, vss, BBS= Clear.  

## 2018-04-14 ENCOUNTER — Telehealth: Payer: Self-pay | Admitting: *Deleted

## 2018-04-14 NOTE — Telephone Encounter (Signed)
  Follow up Call-  Call back number 04/13/2018  Post procedure Call Back phone  # [phone]  Permission to leave phone message Yes  Some recent data might be hidden     Patient questions:  Do you have a fever, pain , or abdominal swelling? No. Pain Score  0 *  Have you tolerated food without any problems? Yes.    Have you been able to return to your normal activities? Yes.    Do you have any questions about your discharge instructions: Diet   No. Medications  No. Follow up visit  No.  Do you have questions or concerns about your Care? No.  Actions: * If pain score is 4 or above: No action needed, pain <4.

## 2018-04-16 ENCOUNTER — Encounter: Payer: Self-pay | Admitting: Internal Medicine

## 2018-06-08 LAB — HM DIABETES EYE EXAM

## 2018-08-04 ENCOUNTER — Other Ambulatory Visit: Payer: Self-pay | Admitting: Endocrinology

## 2018-08-05 NOTE — Telephone Encounter (Signed)
Last seen 03-08-18 Last refill 07-06-18 Please advise

## 2018-09-04 ENCOUNTER — Other Ambulatory Visit: Payer: Self-pay | Admitting: Endocrinology

## 2018-09-06 ENCOUNTER — Other Ambulatory Visit: Payer: Self-pay

## 2018-09-06 ENCOUNTER — Ambulatory Visit (INDEPENDENT_AMBULATORY_CARE_PROVIDER_SITE_OTHER): Payer: 59 | Admitting: Endocrinology

## 2018-09-06 ENCOUNTER — Encounter: Payer: Self-pay | Admitting: Endocrinology

## 2018-09-06 ENCOUNTER — Ambulatory Visit: Payer: 59 | Admitting: Endocrinology

## 2018-09-06 DIAGNOSIS — E119 Type 2 diabetes mellitus without complications: Secondary | ICD-10-CM

## 2018-09-06 DIAGNOSIS — E0580 Other thyrotoxicosis without thyrotoxic crisis or storm: Secondary | ICD-10-CM

## 2018-09-06 NOTE — Patient Instructions (Signed)
Blood tests are requested for you today.  We'll let you know about the results.  Please come back for a follow-up appointment in 6 months.   

## 2018-09-06 NOTE — Addendum Note (Signed)
Addended by: Renato Shin on: 09/06/2018 09:35 AM   Modules accepted: Level of Service

## 2018-09-06 NOTE — Progress Notes (Addendum)
Subjective:    Patient ID: Andrew Morrow, male    DOB: [birth year], 63 y.o.   MRN: [id number]  HPI  telehealth visit today via telephone visit, x 10 minutes. Alternatives to telehealth are presented to this patient, and the patient agrees to the telehealth visit.  Pt is advised of the cost of the visit, and agrees to this, also.   Patient is at home, and I am at the office.   Pt returns for f/u of diabetes mellitus:  DM type: 1, in remission Dx'ed: 1017 Complications: polyneuropathy.  Therapy: insulin 2011-2014; no med since then.   DKA: never.  Severe hypoglycemia: never.  Pancreatitis: never.  Other: he has not recently required medication.   Interval history: He says cbg's vary from 95-116.    Pt also has hyperthyroidism (dx'ed 2000; he chose thionamide rx; he was off rx from 2005-2007, but it recurred, so he has been on tapazole since then; he has never had thyroid imaging).   Past Medical History:  Diagnosis Date  . Adenoma of colon   . Cataract    forming   . Chronic kidney disease    left upper pole stone  . Diabetes mellitus without complication (HCC)    diet controlled- pt states uses lants PRN only- no shot in 8 months  . HTN (hypertension)   . Hyperlipidemia   . Hyperthyroidism     Past Surgical History:  Procedure Laterality Date  . COLONOSCOPY    . HERNIA REPAIR    . KIDNEY STONE SURGERY    . POLYPECTOMY    . TONSILLECTOMY    . VASECTOMY      Social History   Socioeconomic History  . Marital status: Married    Spouse name: Not on file  . Number of children: Not on file  . Years of education: Not on file  . Highest education level: Not on file  Occupational History  . Not on file  Social Needs  . Financial resource strain: Not on file  . Food insecurity:    Worry: Not on file    Inability: Not on file  . Transportation needs:    Medical: Not on file    Non-medical: Not on file  Tobacco Use  . Smoking status: Never Smoker  . Smokeless  tobacco: Former Systems developer    Types: Chew  Substance and Sexual Activity  . Alcohol use: No    Alcohol/week: 0.0 standard drinks  . Drug use: No  . Sexual activity: Not on file  Lifestyle  . Physical activity:    Days per week: Not on file    Minutes per session: Not on file  . Stress: Not on file  Relationships  . Social connections:    Talks on phone: Not on file    Gets together: Not on file    Attends religious service: Not on file    Active member of club or organization: Not on file    Attends meetings of clubs or organizations: Not on file    Relationship status: Not on file  . Intimate partner violence:    Fear of current or ex partner: Not on file    Emotionally abused: Not on file    Physically abused: Not on file    Forced sexual activity: Not on file  Other Topics Concern  . Not on file  Social History Narrative   Works as Chief Financial Officer   Married    Current Outpatient Medications on File Prior to  Visit  Medication Sig Dispense Refill  . acetaminophen (TYLENOL) 500 MG tablet Take 500 mg by mouth every 6 (six) hours as needed for moderate pain.    Marland Kitchen aspirin 81 MG tablet Take 81 mg by mouth every morning.     Marland Kitchen atorvastatin (LIPITOR) 10 MG tablet TAKE 1 TABLET BY MOUTH EVERY DAY 90 tablet 2  . Bisacodyl (LAXATIVE PO) Take 1 tablet by mouth daily as needed (constipation).    Marland Kitchen lisinopril-hydrochlorothiazide (PRINZIDE,ZESTORETIC) 20-25 MG tablet TAKE 1 TABLET BY MOUTH DAILY. 90 tablet 2  . methimazole (TAPAZOLE) 10 MG tablet TAKE 1 TABLET EVERYDAY 90 tablet 1  . Multiple Vitamin (MULTIVITAMIN) tablet Take 1 tablet by mouth every morning.     . ONE TOUCH ULTRA TEST test strip USE AS DIRECTED TO TEST BLOOD SUGAR EVERY DAY 100 each 2  . sildenafil (VIAGRA) 100 MG tablet TAKE 1 TABLET BY MOUTH EVERY DAY AS NEEDED 3 tablet 39  . tamsulosin (FLOMAX) 0.4 MG CAPS capsule Take 1 capsule (0.4 mg total) by mouth daily. 30 capsule 0   No current facility-administered  medications on file prior to visit.     No Known Allergies  Family History  Problem Relation Age of Onset  . Throat cancer Mother        heavy smoker   . Esophageal cancer Mother   . Other Son        Committed Suicide  . Colon cancer Neg Hx   . Colon polyps Neg Hx   . Rectal cancer Neg Hx   . Stomach cancer Neg Hx     Review of Systems Denies fever.      Objective:   Physical Exam      Assessment & Plan:  Hyperthyroidism: due for recheck Type 1 DM, in remission: apparently well-controlled.    Patient Instructions  Blood tests are requested for you today.  We'll let you know about the results.  Please come back for a follow-up appointment in 6 months.

## 2018-10-10 ENCOUNTER — Other Ambulatory Visit: Payer: Self-pay | Admitting: Endocrinology

## 2018-10-10 NOTE — Telephone Encounter (Signed)
Please refill x 3 months Further refills would have to be considered by new PCP   

## 2018-11-09 ENCOUNTER — Other Ambulatory Visit: Payer: Self-pay | Admitting: Endocrinology

## 2019-01-16 ENCOUNTER — Other Ambulatory Visit: Payer: Self-pay | Admitting: Endocrinology

## 2019-01-17 NOTE — Telephone Encounter (Signed)
Please refill x 3 months Further refills would have to be considered by new PCP   

## 2019-03-03 ENCOUNTER — Other Ambulatory Visit: Payer: Self-pay

## 2019-03-07 ENCOUNTER — Encounter: Payer: Self-pay | Admitting: Endocrinology

## 2019-03-07 ENCOUNTER — Ambulatory Visit: Payer: 59 | Admitting: Endocrinology

## 2019-03-07 ENCOUNTER — Other Ambulatory Visit: Payer: Self-pay

## 2019-03-07 VITALS — BP 164/80 | HR 71 | Ht 67.0 in | Wt 173.0 lb

## 2019-03-07 DIAGNOSIS — E119 Type 2 diabetes mellitus without complications: Secondary | ICD-10-CM

## 2019-03-07 DIAGNOSIS — E0590 Thyrotoxicosis, unspecified without thyrotoxic crisis or storm: Secondary | ICD-10-CM | POA: Diagnosis not present

## 2019-03-07 DIAGNOSIS — Z23 Encounter for immunization: Secondary | ICD-10-CM | POA: Diagnosis not present

## 2019-03-07 DIAGNOSIS — M79672 Pain in left foot: Secondary | ICD-10-CM

## 2019-03-07 HISTORY — DX: Pain in left foot: M79.672

## 2019-03-07 LAB — BASIC METABOLIC PANEL
BUN: 13 mg/dL (ref 6–23)
CO2: 27 mEq/L (ref 19–32)
Calcium: 9.4 mg/dL (ref 8.4–10.5)
Chloride: 102 mEq/L (ref 96–112)
Creatinine, Ser: 1.08 mg/dL (ref 0.40–1.50)
GFR: 69.18 mL/min (ref >60.00)
Glucose, Bld: 127 mg/dL — ABNORMAL HIGH (ref 70–99)
Potassium: 4.6 mEq/L (ref 3.5–5.1)
Sodium: 138 mEq/L (ref 135–145)

## 2019-03-07 LAB — POCT GLYCOSYLATED HEMOGLOBIN (HGB A1C): Hemoglobin A1C: 6.2 % — AB (ref 4.0–5.6)

## 2019-03-07 LAB — T4, FREE: Free T4: 0.7 ng/dL (ref 0.60–1.60)

## 2019-03-07 LAB — TSH: TSH: 1.59 u[IU]/mL (ref 0.35–4.50)

## 2019-03-07 MED ORDER — LISINOPRIL-HYDROCHLOROTHIAZIDE 20-25 MG PO TABS: 1.0000 | ORAL_TABLET | Freq: Every day | ORAL | 0 refills | Status: DC

## 2019-03-07 NOTE — Progress Notes (Signed)
Subjective:    Patient ID: Andrew Morrow, male    DOB: 12/31/1955, 63 y.o.   MRN: [id number]  HPI Pt returns for f/u of diabetes mellitus:  DM type: 1, in remission Dx'ed: [id number] Complications: polyneuropathy.  Therapy:  no medication now   DKA: never.  Severe hypoglycemia: never.  Pancreatitis: never.  Other: he took insulin 2011-2014; he has not recently required medication.   Interval history: He says cbg's vary from 95-116.    Pt also has hyperthyroidism (dx'ed 2000; he chose thionamide rx; he was off rx from 2005-2007, but it recurred, so he has been on tapazole since then; he has never had thyroid imaging). Past Medical History:  Diagnosis Date  . Adenoma of colon   . Cataract    forming   . Chronic kidney disease    left upper pole stone  . Diabetes mellitus without complication (HCC)    diet controlled- pt states uses lants PRN only- no shot in 8 months  . HTN (hypertension)   . Hyperlipidemia   . Hyperthyroidism     Past Surgical History:  Procedure Laterality Date  . COLONOSCOPY    . HERNIA REPAIR    . KIDNEY STONE SURGERY    . POLYPECTOMY    . TONSILLECTOMY    . VASECTOMY      Social History   Socioeconomic History  . Marital status: Married    Spouse name: Not on file  . Number of children: Not on file  . Years of education: Not on file  . Highest education level: Not on file  Occupational History  . Not on file  Social Needs  . Financial resource strain: Not on file  . Food insecurity    Worry: Not on file    Inability: Not on file  . Transportation needs    Medical: Not on file    Non-medical: Not on file  Tobacco Use  . Smoking status: Never Smoker  . Smokeless tobacco: Former Systems developer    Types: Chew  Substance and Sexual Activity  . Alcohol use: No    Alcohol/week: 0.0 standard drinks  . Drug use: No  . Sexual activity: Not on file  Lifestyle  . Physical activity    Days per week: Not on file    Minutes per session: Not on file  .  Stress: Not on file  Relationships  . Social Herbalist on phone: Not on file    Gets together: Not on file    Attends religious service: Not on file    Active member of club or organization: Not on file    Attends meetings of clubs or organizations: Not on file    Relationship status: Not on file  . Intimate partner violence    Fear of current or ex partner: Not on file    Emotionally abused: Not on file    Physically abused: Not on file    Forced sexual activity: Not on file  Other Topics Concern  . Not on file  Social History Narrative   Works as Chief Financial Officer   Married    Current Outpatient Medications on File Prior to Visit  Medication Sig Dispense Refill  . acetaminophen (TYLENOL) 500 MG tablet Take 500 mg by mouth every 6 (six) hours as needed for moderate pain.    Marland Kitchen aspirin 81 MG tablet Take 81 mg by mouth every morning.     Marland Kitchen atorvastatin (LIPITOR) 10 MG tablet TAKE 1 TABLET  BY MOUTH EVERY DAY 90 tablet 2  . Bisacodyl (LAXATIVE PO) Take 1 tablet by mouth daily as needed (constipation).    . methimazole (TAPAZOLE) 10 MG tablet TAKE 1 TABLET BY MOUTH EVERY DAY 90 tablet 1  . Multiple Vitamin (MULTIVITAMIN) tablet Take 1 tablet by mouth every morning.     . ONE TOUCH ULTRA TEST test strip USE AS DIRECTED TO TEST BLOOD SUGAR EVERY DAY 100 each 2  . sildenafil (VIAGRA) 100 MG tablet TAKE 1 TABLET BY MOUTH EVERY DAY AS NEEDED 3 tablet 39  . tamsulosin (FLOMAX) 0.4 MG CAPS capsule Take 1 capsule (0.4 mg total) by mouth daily. 30 capsule 0   No current facility-administered medications on file prior to visit.     No Known Allergies  Family History  Problem Relation Age of Onset  . Throat cancer Mother        heavy smoker   . Esophageal cancer Mother   . Other Son        Committed Suicide  . Colon cancer Neg Hx   . Colon polyps Neg Hx   . Rectal cancer Neg Hx   . Stomach cancer Neg Hx     BP (!) 164/80 (BP Location: Left Arm, Patient Position:  Sitting, Cuff Size: Normal)   Pulse 71   Ht 5\' 7"  (1.702 m)   Wt 173 lb (78.5 kg)   SpO2 98%   BMI 27.10 kg/m   Review of Systems He has left heel pain.      Objective:   Physical Exam VITAL SIGNS:  See vs page GENERAL: no distress Pulses: dorsalis pedis intact bilat.   MSK: no deformity of the feet CV: no leg edema Skin:  no ulcer on the feet.  normal color and temp on the feet. Neuro: sensation is intact to touch on the feet  A1c=6.2%  Lab Results  Component Value Date   TSH 1.59 03/07/2019      Assessment & Plan:  Your blood pressure is high today.  Please see your primary care provider soon, to have it rechecked Type 1 DM: worse, but still no medication is needed now. Hyperthyroidism: well-controlled.  Please continue the same medication.  Patient Instructions  Your blood pressure is high today.  Please see a new primary care provider soon, to have it rechecked.  I have refilled the blood pressure medication once, until then.  Please see a foot specialist.  you will receive a phone call, about a day and time for an appointment.   No medication is needed for the diabetes now, but we should continue to watch it.   Blood tests are requested for you today.  We'll let you know about the results.  Please come back for a follow-up appointment in 4-6 months.

## 2019-03-07 NOTE — Patient Instructions (Addendum)
Your blood pressure is high today.  Please see a new primary care provider soon, to have it rechecked.  I have refilled the blood pressure medication once, until then.  Please see a foot specialist.  you will receive a phone call, about a day and time for an appointment.   No medication is needed for the diabetes now, but we should continue to watch it.   Blood tests are requested for you today.  We'll let you know about the results.  Please come back for a follow-up appointment in 4-6 months.

## 2019-03-25 ENCOUNTER — Ambulatory Visit (INDEPENDENT_AMBULATORY_CARE_PROVIDER_SITE_OTHER): Payer: 59

## 2019-03-25 ENCOUNTER — Other Ambulatory Visit: Payer: Self-pay | Admitting: Podiatry

## 2019-03-25 ENCOUNTER — Other Ambulatory Visit: Payer: Self-pay

## 2019-03-25 ENCOUNTER — Ambulatory Visit: Payer: 59 | Admitting: Podiatry

## 2019-03-25 ENCOUNTER — Encounter: Payer: Self-pay | Admitting: Podiatry

## 2019-03-25 DIAGNOSIS — D169 Benign neoplasm of bone and articular cartilage, unspecified: Secondary | ICD-10-CM | POA: Diagnosis not present

## 2019-03-25 DIAGNOSIS — M722 Plantar fascial fibromatosis: Secondary | ICD-10-CM | POA: Diagnosis not present

## 2019-03-25 DIAGNOSIS — M2041 Other hammer toe(s) (acquired), right foot: Secondary | ICD-10-CM | POA: Diagnosis not present

## 2019-03-25 DIAGNOSIS — M79672 Pain in left foot: Secondary | ICD-10-CM

## 2019-03-25 DIAGNOSIS — L84 Corns and callosities: Secondary | ICD-10-CM

## 2019-03-25 MED ORDER — DICLOFENAC SODIUM 75 MG PO TBEC: 75.0000 mg | DELAYED_RELEASE_TABLET | Freq: Two times a day (BID) | ORAL | 2 refills | Status: DC

## 2019-03-25 NOTE — Progress Notes (Signed)
   Subjective:    Patient ID: Andrew Morrow, male    DOB: 1955/05/14, 63 y.o.   MRN: [id number]  HPI    Review of Systems  All other systems reviewed and are negative.      Objective:   Physical Exam        Assessment & Plan:

## 2019-03-25 NOTE — Patient Instructions (Signed)

## 2019-03-25 NOTE — Progress Notes (Signed)
Subjective:   Patient ID: Andrew Morrow, male   DOB: 63 y.o.   MRN: [id number]   HPI Patient presents stating he has had left heel pain for around 6 months and is also getting the corn on the right fifth digit and underneath his left fifth metatarsal that becomes painful.  Patient states that he has tried shoe gear modification and stretching without relief of symptoms and does not smoke likes to be active and has diabetes under good control   Review of Systems  All other systems reviewed and are negative.       Objective:  Physical Exam Vitals signs and nursing note reviewed.  Constitutional:      Appearance: He is well-developed.  Pulmonary:     Effort: Pulmonary effort is normal.  Musculoskeletal: Normal range of motion.  Skin:    General: Skin is warm.  Neurological:     Mental Status: He is alert.     Neurovascular status intact muscle strength found to be adequate range of motion within normal limits.  Patient was noted to have exquisite discomfort plantar aspect left heel at the insertional point tendon calcaneus and is noted to have a small corn in her site fifth digit right and fifth metatarsal left with good digital perfusion well oriented x3 F2     Assessment:  Acute plantar fasciitis left with inflammation fluid buildup and exostotic lesion fifth digit right plantarflexed fifth metatarsal left     Plan:  H&P reviewed all conditions debrided lesions and today focused on the acute inflammation.  I did sterile prep injected the fascia 3 mg Kenalog 5 mg Xylocaine applied fascial brace to lift up the arch and gave instructions for stretching shoe gear modification.  Reappoint 1 week to recheck  X-rays indicate spur plantar left heel and rotated fifth digit right with exostotic lesion noted

## 2019-04-01 ENCOUNTER — Ambulatory Visit: Payer: 59 | Admitting: Podiatry

## 2019-06-12 ENCOUNTER — Other Ambulatory Visit: Payer: Self-pay | Admitting: Podiatry

## 2019-06-27 LAB — HM DIABETES EYE EXAM

## 2019-07-14 ENCOUNTER — Other Ambulatory Visit: Payer: Self-pay | Admitting: Endocrinology

## 2019-08-28 ENCOUNTER — Other Ambulatory Visit: Payer: Self-pay | Admitting: Endocrinology

## 2019-09-01 ENCOUNTER — Other Ambulatory Visit: Payer: Self-pay

## 2019-09-05 ENCOUNTER — Ambulatory Visit: Payer: 59 | Admitting: Endocrinology

## 2019-09-05 ENCOUNTER — Other Ambulatory Visit: Payer: Self-pay

## 2019-09-05 ENCOUNTER — Encounter: Payer: Self-pay | Admitting: Endocrinology

## 2019-09-05 VITALS — BP 138/72 | HR 75 | Wt 173.2 lb

## 2019-09-05 DIAGNOSIS — E119 Type 2 diabetes mellitus without complications: Secondary | ICD-10-CM | POA: Diagnosis not present

## 2019-09-05 LAB — POCT GLYCOSYLATED HEMOGLOBIN (HGB A1C): Hemoglobin A1C: 7.4 % — AB (ref 4.0–5.6)

## 2019-09-05 LAB — T4, FREE: Free T4: 0.83 ng/dL (ref 0.60–1.60)

## 2019-09-05 LAB — TSH: TSH: 2.48 u[IU]/mL (ref 0.35–4.50)

## 2019-09-05 MED ORDER — SILDENAFIL CITRATE 100 MG PO TABS: 100.0000 mg | ORAL_TABLET | Freq: Every day | ORAL | 11 refills | Status: DC | PRN

## 2019-09-05 MED ORDER — METFORMIN HCL ER 500 MG PO TB24: 500.0000 mg | ORAL_TABLET | Freq: Every day | ORAL | 3 refills | Status: DC

## 2019-09-05 NOTE — Patient Instructions (Addendum)
I have sent a prescription to your pharmacy, for the blood sugar. Blood tests are requested for you today.  We'll let you know about the results.  If ever you have fever while taking methimazole, stop it and call us, even if the reason is obvious, because of the risk of a rare side-effect. It is best to never miss the methimazole.  However, if you do miss it, next best is to double up the next time.   Please come back for a follow-up appointment in 2-3 months.

## 2019-09-05 NOTE — Progress Notes (Signed)
Subjective:    Patient ID: Andrew Morrow, male    DOB: May 30, 1955, 64 y.o.   MRN: [id number]  HPI Pt returns for f/u of diabetes mellitus:  DM type: 1, in remission Dx'ed: [id number] Complications: PN.   Therapy:  no medication now   DKA: never.  Severe hypoglycemia: never.  Pancreatitis: never.  Other: he took insulin 2011-2014; he has not recently required medication.   Interval history: He brings a record of his cbg's which I have reviewed today. cbg's vary from 87-144.    Pt also has hyperthyroidism (dx'ed 2000; he chose thionamide rx; he was off rx from 2005-2007, but it recurred, so he has been on tapazole since then; he has never had thyroid imaging).  Past Medical History:  Diagnosis Date  . Adenoma of colon   . Cataract    forming   . Chronic kidney disease    left upper pole stone  . Diabetes mellitus without complication (HCC)    diet controlled- pt states uses lants PRN only- no shot in 8 months  . HTN (hypertension)   . Hyperlipidemia   . Hyperthyroidism     Past Surgical History:  Procedure Laterality Date  . COLONOSCOPY    . HERNIA REPAIR    . KIDNEY STONE SURGERY    . POLYPECTOMY    . TONSILLECTOMY    . VASECTOMY      Social History   Socioeconomic History  . Marital status: Married    Spouse name: Not on file  . Number of children: Not on file  . Years of education: Not on file  . Highest education level: Not on file  Occupational History  . Not on file  Tobacco Use  . Smoking status: Never Smoker  . Smokeless tobacco: Former Systems developer    Types: Chew  Substance and Sexual Activity  . Alcohol use: No    Alcohol/week: 0.0 standard drinks  . Drug use: No  . Sexual activity: Not on file  Other Topics Concern  . Not on file  Social History Narrative   Works as Chief Financial Officer   Married   Social Determinants of Radio broadcast assistant Strain:   . Difficulty of Paying Living Expenses:   Food Insecurity:   . Worried About Ship broker in the Last Year:   . Arboriculturist in the Last Year:   Transportation Needs:   . Film/video editor (Medical):   Marland Kitchen Lack of Transportation (Non-Medical):   Physical Activity:   . Days of Exercise per Week:   . Minutes of Exercise per Session:   Stress:   . Feeling of Stress :   Social Connections:   . Frequency of Communication with Friends and Family:   . Frequency of Social Gatherings with Friends and Family:   . Attends Religious Services:   . Active Member of Clubs or Organizations:   . Attends Archivist Meetings:   Marland Kitchen Marital Status:   Intimate Partner Violence:   . Fear of Current or Ex-Partner:   . Emotionally Abused:   Marland Kitchen Physically Abused:   . Sexually Abused:     Current Outpatient Medications on File Prior to Visit  Medication Sig Dispense Refill  . acetaminophen (TYLENOL) 500 MG tablet Take 500 mg by mouth every 6 (six) hours as needed for moderate pain.    Marland Kitchen atorvastatin (LIPITOR) 10 MG tablet TAKE 1 TABLET BY MOUTH EVERY DAY 90 tablet 2  . Bisacodyl (  LAXATIVE PO) Take 1 tablet by mouth daily as needed (constipation).    Marland Kitchen diclofenac (VOLTAREN) 75 MG EC tablet TAKE 1 TABLET BY MOUTH TWICE A DAY 50 tablet 2  . glucose blood (ONETOUCH ULTRA) test strip 1 each by Other route daily. E11.9 100 strip 0  . lisinopril-hydrochlorothiazide (ZESTORETIC) 20-25 MG tablet Take 1 tablet by mouth daily. 90 tablet 0  . methimazole (TAPAZOLE) 10 MG tablet TAKE 1 TABLET BY MOUTH EVERY DAY 90 tablet 1  . Multiple Vitamin (MULTIVITAMIN) tablet Take 1 tablet by mouth every morning.     . tamsulosin (FLOMAX) 0.4 MG CAPS capsule Take 1 capsule (0.4 mg total) by mouth daily. 30 capsule 0   No current facility-administered medications on file prior to visit.    No Known Allergies  Family History  Problem Relation Age of Onset  . Throat cancer Mother        heavy smoker   . Esophageal cancer Mother   . Other Son        Committed Suicide  . Colon cancer Neg Hx    . Colon polyps Neg Hx   . Rectal cancer Neg Hx   . Stomach cancer Neg Hx     BP 138/72 (BP Location: Left Arm, Patient Position: Sitting, Cuff Size: Normal)   Pulse 75   Wt 173 lb 3.2 oz (78.6 kg)   SpO2 98%   BMI 27.13 kg/m    Review of Systems Denies fever.      Objective:   Physical Exam VITAL SIGNS:  See vs page GENERAL: no distress Pulses: dorsalis pedis intact bilat.   MSK: no deformity of the feet CV: no leg edema Skin:  no ulcer on the feet.  normal color and temp on the feet. Neuro: sensation is intact to touch on the feet  Lab Results  Component Value Date   HGBA1C 7.4 (A) 09/05/2019   Lab Results  Component Value Date   TSH 2.48 09/05/2019      Assessment & Plan:  Type 1 DM: worse. He is coming out of remission.   Hyperthyroidism: well-controlled.  Please continue the same medication.  Patient Instructions  I have sent a prescription to your pharmacy, for the blood sugar. Blood tests are requested for you today.  We'll let you know about the results.  If ever you have fever while taking methimazole, stop it and call us, even if the reason is obvious, because of the risk of a rare side-effect. It is best to never miss the methimazole.  However, if you do miss it, next best is to double up the next time.   Please come back for a follow-up appointment in 2-3 months.

## 2019-10-12 ENCOUNTER — Other Ambulatory Visit: Payer: Self-pay | Admitting: Endocrinology

## 2019-10-16 ENCOUNTER — Other Ambulatory Visit: Payer: Self-pay | Admitting: Endocrinology

## 2019-11-01 ENCOUNTER — Other Ambulatory Visit: Payer: Self-pay | Admitting: Internal Medicine

## 2019-11-01 ENCOUNTER — Other Ambulatory Visit: Payer: Self-pay | Admitting: Endocrinology

## 2019-11-03 ENCOUNTER — Other Ambulatory Visit: Payer: Self-pay | Admitting: Internal Medicine

## 2019-11-06 ENCOUNTER — Other Ambulatory Visit: Payer: Self-pay | Admitting: Internal Medicine

## 2019-11-25 ENCOUNTER — Other Ambulatory Visit: Payer: Self-pay | Admitting: Endocrinology

## 2019-11-25 ENCOUNTER — Other Ambulatory Visit: Payer: Self-pay | Admitting: Internal Medicine

## 2019-12-06 ENCOUNTER — Encounter: Payer: Self-pay | Admitting: Endocrinology

## 2019-12-06 ENCOUNTER — Ambulatory Visit: Payer: 59 | Admitting: Endocrinology

## 2019-12-06 ENCOUNTER — Other Ambulatory Visit: Payer: Self-pay

## 2019-12-06 VITALS — BP 132/80 | HR 77 | Ht 67.0 in | Wt 174.4 lb

## 2019-12-06 DIAGNOSIS — E119 Type 2 diabetes mellitus without complications: Secondary | ICD-10-CM | POA: Diagnosis not present

## 2019-12-06 LAB — POCT GLYCOSYLATED HEMOGLOBIN (HGB A1C): Hemoglobin A1C: 7.1 % — AB (ref 4.0–5.6)

## 2019-12-06 MED ORDER — METFORMIN HCL ER 500 MG PO TB24: 1500.0000 mg | ORAL_TABLET | Freq: Every day | ORAL | 3 refills | Status: DC

## 2019-12-06 NOTE — Progress Notes (Signed)
Subjective:    Patient ID: Andrew Morrow, male    DOB: [birth year], 64 y.o.   MRN: [id number]  HPI Pt returns for f/u of diabetes mellitus:  DM type: 1, in remission Dx'ed: 9528 Complications: PN.   Therapy: metformin DKA: never.  Severe hypoglycemia: never.  Pancreatitis: never.  Other: he took insulin 2011-2014; he has not recently required medication.    Interval history: He brings a record of his cbg's which I have reviewed today. cbg's vary from 72-226.  pt states he feels well in general. Pt also has hyperthyroidism (dx'ed 2000; he chose thionamide rx; he was off rx from 2005-2007, but it recurred, so he has been on tapazole since then; he has never had thyroid imaging).   Past Medical History:  Diagnosis Date  . Adenoma of colon   . Cataract    forming   . Chronic kidney disease    left upper pole stone  . Diabetes mellitus without complication (HCC)    diet controlled- pt states uses lants PRN only- no shot in 8 months  . HTN (hypertension)   . Hyperlipidemia   . Hyperthyroidism     Past Surgical History:  Procedure Laterality Date  . COLONOSCOPY    . HERNIA REPAIR    . KIDNEY STONE SURGERY    . POLYPECTOMY    . TONSILLECTOMY    . VASECTOMY      Social History   Socioeconomic History  . Marital status: Married    Spouse name: Not on file  . Number of children: Not on file  . Years of education: Not on file  . Highest education level: Not on file  Occupational History  . Not on file  Tobacco Use  . Smoking status: Never Smoker  . Smokeless tobacco: Former Systems developer    Types: Chew  Substance and Sexual Activity  . Alcohol use: No    Alcohol/week: 0.0 standard drinks  . Drug use: No  . Sexual activity: Not on file  Other Topics Concern  . Not on file  Social History Narrative   Works as Chief Financial Officer   Married   Social Determinants of Radio broadcast assistant Strain:   . Difficulty of Paying Living Expenses:   Food Insecurity:   .  Worried About Charity fundraiser in the Last Year:   . Arboriculturist in the Last Year:   Transportation Needs:   . Film/video editor (Medical):   Marland Kitchen Lack of Transportation (Non-Medical):   Physical Activity:   . Days of Exercise per Week:   . Minutes of Exercise per Session:   Stress:   . Feeling of Stress :   Social Connections:   . Frequency of Communication with Friends and Family:   . Frequency of Social Gatherings with Friends and Family:   . Attends Religious Services:   . Active Member of Clubs or Organizations:   . Attends Archivist Meetings:   Marland Kitchen Marital Status:   Intimate Partner Violence:   . Fear of Current or Ex-Partner:   . Emotionally Abused:   Marland Kitchen Physically Abused:   . Sexually Abused:     Current Outpatient Medications on File Prior to Visit  Medication Sig Dispense Refill  . acetaminophen (TYLENOL) 500 MG tablet Take 500 mg by mouth every 6 (six) hours as needed for moderate pain.    Marland Kitchen atorvastatin (LIPITOR) 10 MG tablet TAKE 1 TABLET BY MOUTH EVERY DAY 90 tablet 2  .  Bisacodyl (LAXATIVE PO) Take 1 tablet by mouth daily as needed (constipation).    Marland Kitchen diclofenac (VOLTAREN) 75 MG EC tablet TAKE 1 TABLET BY MOUTH TWICE A DAY 50 tablet 2  . lisinopril-hydrochlorothiazide (ZESTORETIC) 20-25 MG tablet TAKE 1 TABLET BY MOUTH EVERY DAY 90 tablet 0  . methimazole (TAPAZOLE) 10 MG tablet TAKE 1 TABLET BY MOUTH EVERY DAY 90 tablet 1  . Multiple Vitamin (MULTIVITAMIN) tablet Take 1 tablet by mouth every morning.     Glory Rosebush ULTRA test strip USE AS DIRECTED TO CHECK BLOOD SUGAR DAILY E11.9 100 strip 0  . sildenafil (VIAGRA) 100 MG tablet Take 1 tablet (100 mg total) by mouth daily as needed. 10 tablet 11  . tamsulosin (FLOMAX) 0.4 MG CAPS capsule Take 1 capsule (0.4 mg total) by mouth daily. 30 capsule 0   No current facility-administered medications on file prior to visit.    No Known Allergies  Family History  Problem Relation Age of Onset  .  Throat cancer Mother        heavy smoker   . Esophageal cancer Mother   . Other Son        Committed Suicide  . Colon cancer Neg Hx   . Colon polyps Neg Hx   . Rectal cancer Neg Hx   . Stomach cancer Neg Hx     BP (!) 132/80   Pulse 77   Ht 5\' 7"  (1.702 m)   Wt 174 lb 6.4 oz (79.1 kg)   SpO2 97%   BMI 27.31 kg/m    Review of Systems     Objective:   Physical Exam VITAL SIGNS:  See vs page GENERAL: no distress Pulses: dorsalis pedis intact bilat.   MSK: no deformity of the feet CV: no leg edema Skin:  no ulcer on the feet.  normal color and temp on the feet.   Neuro: sensation is intact to touch on the feet.    A1c=7.1%  Lab Results  Component Value Date   CREATININE 1.08 03/07/2019   BUN 13 03/07/2019   NA 138 03/07/2019   K 4.6 03/07/2019   CL 102 03/07/2019   CO2 27 03/07/2019       Assessment & Plan:  Type 2 DM: he would benefit from increased rx, if it can be done with a regimen that avoids or minimizes hypoglycemia.  Patient Instructions  I have sent a prescription to your pharmacy, to increase the metformin.   If ever you have fever while taking methimazole, stop it and call us, even if the reason is obvious, because of the risk of a rare side-effect.  It is best to never miss the methimazole.  However, if you do miss it, next best is to double up the next time.   Please come back for a follow-up appointment in 3 months.

## 2019-12-06 NOTE — Patient Instructions (Addendum)
I have sent a prescription to your pharmacy, to increase the metformin.   If ever you have fever while taking methimazole, stop it and call us, even if the reason is obvious, because of the risk of a rare side-effect.  It is best to never miss the methimazole.  However, if you do miss it, next best is to double up the next time.   Please come back for a follow-up appointment in 3 months.

## 2020-02-19 ENCOUNTER — Other Ambulatory Visit: Payer: Self-pay | Admitting: Endocrinology

## 2020-02-19 NOTE — Telephone Encounter (Signed)
Please forward refill request to pt's primary care provider.   

## 2020-03-05 ENCOUNTER — Ambulatory Visit: Payer: 59 | Admitting: Endocrinology

## 2020-03-13 ENCOUNTER — Other Ambulatory Visit: Payer: Self-pay

## 2020-03-13 ENCOUNTER — Ambulatory Visit: Payer: 59 | Admitting: Endocrinology

## 2020-03-13 ENCOUNTER — Encounter: Payer: Self-pay | Admitting: Endocrinology

## 2020-03-13 VITALS — BP 148/78 | HR 67 | Ht 67.0 in | Wt 168.0 lb

## 2020-03-13 DIAGNOSIS — E119 Type 2 diabetes mellitus without complications: Secondary | ICD-10-CM

## 2020-03-13 LAB — POCT GLYCOSYLATED HEMOGLOBIN (HGB A1C): Hemoglobin A1C: 6.5 % — AB (ref 4.0–5.6)

## 2020-03-13 LAB — T4, FREE: Free T4: 0.77 ng/dL (ref 0.60–1.60)

## 2020-03-13 LAB — TSH: TSH: 2.49 u[IU]/mL (ref 0.35–4.50)

## 2020-03-13 MED ORDER — PIOGLITAZONE HCL 15 MG PO TABS: 15.0000 mg | ORAL_TABLET | Freq: Every day | ORAL | 3 refills | Status: DC

## 2020-03-13 NOTE — Patient Instructions (Addendum)
Your blood pressure is high today.  Please see a primary care provider soon, to have it rechecked I have sent a prescription to your pharmacy, to add pioglitazone.  Blood tests are requested for you today.  We'll let you know about the results.  If ever you have fever while taking methimazole, stop it and call us, even if the reason is obvious, because of the risk of a rare side-effect.  It is best to never miss the methimazole.  However, if you do miss it, next best is to double up the next time.   Please come back for a follow-up appointment in 3 months.

## 2020-03-13 NOTE — Progress Notes (Signed)
Subjective:    Patient ID: Andrew Morrow, male    DOB: 02-Dec-1955, 64 y.o.   MRN: [id number]  HPI Pt returns for f/u of diabetes mellitus:  DM type: 1, in remission Dx'ed: 0076 Complications: PN.   Therapy: metformin DKA: never.  Severe hypoglycemia: never.  Pancreatitis: never.  Other: he took insulin 2011-2014.  Interval history: He brings a record of his cbg's which I have reviewed today. cbg's vary from 91-156.  pt states he feels well in general.   Pt also has hyperthyroidism (dx'ed 2000; he chose thionamide rx; he was off rx from 2005-2007, but it recurred, so he has been on tapazole since then; he has never had thyroid imaging).   Past Medical History:  Diagnosis Date  . Adenoma of colon   . Cataract    forming   . Chronic kidney disease    left upper pole stone  . Diabetes mellitus without complication (HCC)    diet controlled- pt states uses lants PRN only- no shot in 8 months  . HTN (hypertension)   . Hyperlipidemia   . Hyperthyroidism     Past Surgical History:  Procedure Laterality Date  . COLONOSCOPY    . HERNIA REPAIR    . KIDNEY STONE SURGERY    . POLYPECTOMY    . TONSILLECTOMY    . VASECTOMY      Social History   Socioeconomic History  . Marital status: Married    Spouse name: Not on file  . Number of children: Not on file  . Years of education: Not on file  . Highest education level: Not on file  Occupational History  . Not on file  Tobacco Use  . Smoking status: Never Smoker  . Smokeless tobacco: Former Systems developer    Types: Chew  Substance and Sexual Activity  . Alcohol use: No    Alcohol/week: 0.0 standard drinks  . Drug use: No  . Sexual activity: Not on file  Other Topics Concern  . Not on file  Social History Narrative   Works as Chief Financial Officer   Married   Social Determinants of Radio broadcast assistant Strain:   . Difficulty of Paying Living Expenses: Not on file  Food Insecurity:   . Worried About Sales executive in the Last Year: Not on file  . Ran Out of Food in the Last Year: Not on file  Transportation Needs:   . Lack of Transportation (Medical): Not on file  . Lack of Transportation (Non-Medical): Not on file  Physical Activity:   . Days of Exercise per Week: Not on file  . Minutes of Exercise per Session: Not on file  Stress:   . Feeling of Stress : Not on file  Social Connections:   . Frequency of Communication with Friends and Family: Not on file  . Frequency of Social Gatherings with Friends and Family: Not on file  . Attends Religious Services: Not on file  . Active Member of Clubs or Organizations: Not on file  . Attends Archivist Meetings: Not on file  . Marital Status: Not on file  Intimate Partner Violence:   . Fear of Current or Ex-Partner: Not on file  . Emotionally Abused: Not on file  . Physically Abused: Not on file  . Sexually Abused: Not on file    Current Outpatient Medications on File Prior to Visit  Medication Sig Dispense Refill  . acetaminophen (TYLENOL) 500 MG tablet Take 500 mg by  mouth every 6 (six) hours as needed for moderate pain.    Marland Kitchen atorvastatin (LIPITOR) 10 MG tablet TAKE 1 TABLET BY MOUTH EVERY DAY 90 tablet 2  . Bisacodyl (LAXATIVE PO) Take 1 tablet by mouth daily as needed (constipation).    Marland Kitchen diclofenac (VOLTAREN) 75 MG EC tablet TAKE 1 TABLET BY MOUTH TWICE A DAY 50 tablet 2  . lisinopril-hydrochlorothiazide (ZESTORETIC) 20-25 MG tablet TAKE 1 TABLET BY MOUTH EVERY DAY 90 tablet 0  . metFORMIN (GLUCOPHAGE-XR) 500 MG 24 hr tablet Take 3 tablets (1,500 mg total) by mouth daily with breakfast. 270 tablet 3  . methimazole (TAPAZOLE) 10 MG tablet TAKE 1 TABLET BY MOUTH EVERY DAY 90 tablet 1  . Multiple Vitamin (MULTIVITAMIN) tablet Take 1 tablet by mouth every morning.     Glory Rosebush ULTRA test strip USE AS DIRECTED TO CHECK BLOOD SUGAR DAILY E11.9 100 strip 0  . sildenafil (VIAGRA) 100 MG tablet Take 1 tablet (100 mg total) by mouth daily  as needed. 10 tablet 11  . tamsulosin (FLOMAX) 0.4 MG CAPS capsule Take 1 capsule (0.4 mg total) by mouth daily. 30 capsule 0   No current facility-administered medications on file prior to visit.    No Known Allergies  Family History  Problem Relation Age of Onset  . Throat cancer Mother        heavy smoker   . Esophageal cancer Mother   . Other Son        Committed Suicide  . Colon cancer Neg Hx   . Colon polyps Neg Hx   . Rectal cancer Neg Hx   . Stomach cancer Neg Hx     BP (!) 148/78   Pulse 67   Ht 5\' 7"  (1.702 m)   Wt 168 lb (76.2 kg)   SpO2 97%   BMI 26.31 kg/m    Review of Systems Denies n/v.      Objective:   Physical Exam VITAL SIGNS:  See vs page GENERAL: no distress Pulses: dorsalis pedis intact bilat.   MSK: no deformity of the feet CV: no leg edema Skin:  no ulcer on the feet.  normal color and temp on the feet.   Neuro: sensation is intact to touch on the feet.    Lab Results  Component Value Date   HGBA1C 6.5 (A) 03/13/2020       Assessment & Plan:  HTN: is noted today Type 1 DM, in remission: worse Hyperthyroidism: recheck today.   Patient Instructions  Your blood pressure is high today.  Please see a primary care provider soon, to have it rechecked I have sent a prescription to your pharmacy, to add pioglitazone.  Blood tests are requested for you today.  We'll let you know about the results.  If ever you have fever while taking methimazole, stop it and call us, even if the reason is obvious, because of the risk of a rare side-effect.  It is best to never miss the methimazole.  However, if you do miss it, next best is to double up the next time.   Please come back for a follow-up appointment in 3 months.

## 2020-03-14 ENCOUNTER — Telehealth: Payer: Self-pay

## 2020-03-14 NOTE — Telephone Encounter (Signed)
-----   Message from Renato Shin, MD sent at 03/13/2020  1:46 PM EDT ----- please contact patient: Thyroid is normal.  Please continue the same medication.  I'll see you next time.

## 2020-03-14 NOTE — Telephone Encounter (Signed)
Patient aware of results and recommendations. °

## 2020-03-19 ENCOUNTER — Other Ambulatory Visit: Payer: Self-pay | Admitting: Endocrinology

## 2020-03-27 ENCOUNTER — Other Ambulatory Visit: Payer: Self-pay | Admitting: Endocrinology

## 2020-04-30 ENCOUNTER — Other Ambulatory Visit: Payer: Self-pay | Admitting: Endocrinology

## 2020-06-29 LAB — HM DIABETES EYE EXAM

## 2020-07-17 ENCOUNTER — Other Ambulatory Visit: Payer: Self-pay

## 2020-07-17 ENCOUNTER — Ambulatory Visit: Payer: 59 | Admitting: Endocrinology

## 2020-07-17 VITALS — BP 160/64 | HR 69 | Ht 67.0 in | Wt 164.0 lb

## 2020-07-17 DIAGNOSIS — E119 Type 2 diabetes mellitus without complications: Secondary | ICD-10-CM | POA: Diagnosis not present

## 2020-07-17 LAB — POCT GLYCOSYLATED HEMOGLOBIN (HGB A1C): Hemoglobin A1C: 5.6 % (ref 4.0–5.6)

## 2020-07-17 NOTE — Patient Instructions (Addendum)
Your blood pressure is high today.  Please see a primary care provider soon, to have it rechecked.   If ever you have fever while taking methimazole, stop it and call us, even if the reason is obvious, because of the risk of a rare side-effect.   It is best to never miss the methimazole.  However, if you do miss it, next best is to double up the next time.   Please come back for a follow-up appointment in 4 months.

## 2020-07-17 NOTE — Progress Notes (Signed)
Subjective:    Patient ID: Andrew Morrow, male    DOB: 06/10/1955, 65 y.o.   MRN: [id number]  HPI Pt returns for f/u of diabetes mellitus:  DM type: 1, in remission.  Dx'ed: 9030 Complications: PN.   Therapy: 2 oral meds.   DKA: never.  Severe hypoglycemia: never.  Pancreatitis: never.  Other: he took insulin 2011-2014.  Interval history: He brings a record of his cbg's which I have reviewed today. cbg's vary from 85-156.  pt states he feels well in general.   Pt also has hyperthyroidism (dx'ed 2000; he chose thionamide rx; he was off rx from 2005-2007, but it recurred, so he has been on tapazole since then; he has never had thyroid imaging).   Past Medical History:  Diagnosis Date  . Adenoma of colon   . Cataract    forming   . Chronic kidney disease    left upper pole stone  . Diabetes mellitus without complication (HCC)    diet controlled- pt states uses lants PRN only- no shot in 8 months  . HTN (hypertension)   . Hyperlipidemia   . Hyperthyroidism     Past Surgical History:  Procedure Laterality Date  . COLONOSCOPY    . HERNIA REPAIR    . KIDNEY STONE SURGERY    . POLYPECTOMY    . TONSILLECTOMY    . VASECTOMY      Social History   Socioeconomic History  . Marital status: Married    Spouse name: Not on file  . Number of children: Not on file  . Years of education: Not on file  . Highest education level: Not on file  Occupational History  . Not on file  Tobacco Use  . Smoking status: Never Smoker  . Smokeless tobacco: Former Systems developer    Types: Chew  Substance and Sexual Activity  . Alcohol use: No    Alcohol/week: 0.0 standard drinks  . Drug use: No  . Sexual activity: Not on file  Other Topics Concern  . Not on file  Social History Narrative   Works as Chief Financial Officer   Married   Social Determinants of Radio broadcast assistant Strain: Not on file  Food Insecurity: Not on file  Transportation Needs: Not on file  Physical Activity: Not  on file  Stress: Not on file  Social Connections: Not on file  Intimate Partner Violence: Not on file    Current Outpatient Medications on File Prior to Visit  Medication Sig Dispense Refill  . acetaminophen (TYLENOL) 500 MG tablet Take 500 mg by mouth every 6 (six) hours as needed for moderate pain.    Marland Kitchen atorvastatin (LIPITOR) 10 MG tablet TAKE 1 TABLET BY MOUTH EVERY DAY 90 tablet 2  . Bisacodyl (LAXATIVE PO) Take 1 tablet by mouth daily as needed (constipation).    Marland Kitchen diclofenac (VOLTAREN) 75 MG EC tablet TAKE 1 TABLET BY MOUTH TWICE A DAY 50 tablet 2  . lisinopril-hydrochlorothiazide (ZESTORETIC) 20-25 MG tablet TAKE 1 TABLET BY MOUTH EVERY DAY 90 tablet 1  . metFORMIN (GLUCOPHAGE-XR) 500 MG 24 hr tablet Take 3 tablets (1,500 mg total) by mouth daily with breakfast. 270 tablet 3  . methimazole (TAPAZOLE) 10 MG tablet TAKE 1 TABLET BY MOUTH EVERY DAY 90 tablet 1  . Multiple Vitamin (MULTIVITAMIN) tablet Take 1 tablet by mouth every morning.     Glory Rosebush ULTRA test strip USE AS DIRECTED TO CHECK BLOOD SUGAR DAILY E11.9 100 strip 1  . pioglitazone (  ACTOS) 15 MG tablet Take 1 tablet (15 mg total) by mouth daily. 90 tablet 3  . sildenafil (VIAGRA) 100 MG tablet Take 1 tablet (100 mg total) by mouth daily as needed. 10 tablet 11  . tamsulosin (FLOMAX) 0.4 MG CAPS capsule Take 1 capsule (0.4 mg total) by mouth daily. 30 capsule 0   No current facility-administered medications on file prior to visit.    No Known Allergies  Family History  Problem Relation Age of Onset  . Throat cancer Mother        heavy smoker   . Esophageal cancer Mother   . Other Son        Committed Suicide  . Colon cancer Neg Hx   . Colon polyps Neg Hx   . Rectal cancer Neg Hx   . Stomach cancer Neg Hx     BP (!) 160/64 (BP Location: Right Arm, Patient Position: Sitting, Cuff Size: Normal)   Pulse 69   Ht 5\' 7"  (1.702 m)   Wt 164 lb (74.4 kg)   SpO2 99%   BMI 25.69 kg/m    Review of Systems      Objective:   Physical Exam VITAL SIGNS:  See vs page GENERAL: no distress Pulses: dorsalis pedis intact bilat.   MSK: no deformity of the feet.   CV: no leg edema Skin:  no ulcer on the feet.  normal color and temp on the feet.   Neuro: sensation is intact to touch on the feet.    Lab Results  Component Value Date   TSH 2.49 03/13/2020    A1c=5.6%    Assessment & Plan:  HTN: is noted today Type 1 DM, in remission.  We'll follow.  Hyperthyroidism: well-controlled.  Please continue the same methimazole.   Patient Instructions  Your blood pressure is high today.  Please see a primary care provider soon, to have it rechecked.   If ever you have fever while taking methimazole, stop it and call us, even if the reason is obvious, because of the risk of a rare side-effect.   It is best to never miss the methimazole.  However, if you do miss it, next best is to double up the next time.   Please come back for a follow-up appointment in 4 months.

## 2020-09-12 ENCOUNTER — Other Ambulatory Visit: Payer: Self-pay | Admitting: Endocrinology

## 2020-09-23 ENCOUNTER — Other Ambulatory Visit: Payer: Self-pay | Admitting: Endocrinology

## 2020-10-03 ENCOUNTER — Encounter: Payer: Self-pay | Admitting: Nurse Practitioner

## 2020-10-03 ENCOUNTER — Other Ambulatory Visit: Payer: Self-pay

## 2020-10-03 ENCOUNTER — Ambulatory Visit: Payer: 59 | Admitting: Nurse Practitioner

## 2020-10-03 VITALS — BP 110/84 | HR 72 | Temp 97.4°F | Ht 67.0 in | Wt 162.2 lb

## 2020-10-03 DIAGNOSIS — E0500 Thyrotoxicosis with diffuse goiter without thyrotoxic crisis or storm: Secondary | ICD-10-CM

## 2020-10-03 DIAGNOSIS — E781 Pure hyperglyceridemia: Secondary | ICD-10-CM

## 2020-10-03 DIAGNOSIS — I10 Essential (primary) hypertension: Secondary | ICD-10-CM

## 2020-10-03 DIAGNOSIS — E119 Type 2 diabetes mellitus without complications: Secondary | ICD-10-CM

## 2020-10-03 DIAGNOSIS — K635 Polyp of colon: Secondary | ICD-10-CM | POA: Insufficient documentation

## 2020-10-03 DIAGNOSIS — Z125 Encounter for screening for malignant neoplasm of prostate: Secondary | ICD-10-CM

## 2020-10-03 NOTE — Assessment & Plan Note (Signed)
Chronic.  Follows closely with endocrinology-continue this collaboration.

## 2020-10-03 NOTE — Assessment & Plan Note (Signed)
Chronic.  Likely secondary to diabetes.  Patient is already on low-dose statin-continue this medication.  We will check liver function and lipid panel when patient returns for fasting labs.  Follow-up 6 months.

## 2020-10-03 NOTE — Assessment & Plan Note (Signed)
Follows closely with endocrinology-continue collaboration.

## 2020-10-03 NOTE — Progress Notes (Signed)
Subjective:    Patient ID: Andrew Morrow, male    DOB: [birth year], 65 y.o.   MRN: [id number]  HPI: Andrew Morrow is a 65 y.o. male presenting for presents for new patient visit to establish care.  Introduced to Designer, jewellery role and practice setting.  All questions answered.  Discussed provider/patient relationship and expectations.  Chief Complaint  Patient presents with  . Establish Care    No medical concern today, had cataract surgery last wk   Patient is currently a Electrical engineer.  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  Previously treated at the [place] and reports he may return there after he retires.  DIABETES Patient follows closely with Dr. Loanne Drilling with endocrinology for diabetes and hyperthyroidism.  Reports he was on insulin in the past and brother has a history of type 1 diabetes.  He is very diligent about recording his blood sugar readings.  In general, he tries to avoid sugary foods and drinks, but admits his diet could use improvement. Currently for diabetes, he is taking pioglitazone 15 mg daily, Glucophage XR 500 mg 3 times daily with meals. Hypoglycemic episodes:yes; sometimes in summer Polydipsia/polyuria: no Visual disturbance: no; recent cataract surgery last week goes back tomorrowleft eye surgery  Chest pain: no Paresthesias: no Glucose Monitoring: Multiple times daily -did not bring log with him today Taking Insulin?: no Blood Pressure Monitoring: not checking Retinal Examination: Up to Date Foot Exam: Up to Date Diabetic Education: Completed Pneumovax: Up to Date Influenza: Up to Date Aspirin: no   HYPERTENSION/HYPERLIPIDEMIA Patient is currently taking lisinopril-hydrochlorothiazide 20-25 mg daily for blood pressure.  He does not check his blood pressure at home.  He is currently taking atorvastatin 10 mg daily for high cholesterol.  He is not fasting today. Hypertension status: controlled  Satisfied with current treatment? yes Duration  of hypertension: chronic BP monitoring frequency:  not checking BP medication side effects:  no Medication compliance: excellent Aspirin: no Recurrent headaches: no Visual changes: no Palpitations: no Dyspnea: no Chest pain: no Lower extremity edema: no Dizzy/lightheaded: no  The 10-year ASCVD risk score Mikey Bussing DC Jr., et al., 2013) is: 25.2%   Values used to calculate the score:     Age: 65 years     Sex: Male     Is Non-Hispanic African American: No     Diabetic: Yes     Tobacco smoker: Yes     Systolic Blood Pressure: 347 mmHg     Is BP treated: Yes     HDL Cholesterol: 42.1 mg/dL     Total Cholesterol: 157 mg/dL  Patient reports he had left cataract removal last week and is taking eyedrops.  He is due to follow-up with the eye surgeon tomorrow.  No Known Allergies   Outpatient Encounter Medications as of 10/03/2020  Medication Sig  . acetaminophen (TYLENOL) 500 MG tablet Take 500 mg by mouth every 6 (six) hours as needed for moderate pain.  Marland Kitchen atorvastatin (LIPITOR) 10 MG tablet TAKE 1 TABLET BY MOUTH EVERY DAY  . Bisacodyl (LAXATIVE PO) Take 1 tablet by mouth daily as needed (constipation).  Marland Kitchen diclofenac (VOLTAREN) 75 MG EC tablet TAKE 1 TABLET BY MOUTH TWICE A DAY  . gatifloxacin (ZYMAXID) 0.5 % SOLN 1 drop.  Marland Kitchen ketorolac (ACULAR) 0.4 % SOLN 1 drop 4 (four) times daily.  Marland Kitchen lisinopril-hydrochlorothiazide (ZESTORETIC) 20-25 MG tablet TAKE 1 TABLET BY MOUTH EVERY DAY  . metFORMIN (GLUCOPHAGE-XR) 500 MG 24 hr tablet Take 3 tablets (1,500  mg total) by mouth daily with breakfast.  . methimazole (TAPAZOLE) 10 MG tablet TAKE 1 TABLET BY MOUTH EVERY DAY  . Multiple Vitamin (MULTIVITAMIN) tablet Take 1 tablet by mouth every morning.   Glory Rosebush ULTRA test strip USE AS DIRECTED TO CHECK BLOOD SUGAR DAILY E11.9  . pioglitazone (ACTOS) 15 MG tablet Take 1 tablet (15 mg total) by mouth daily.  . prednisoLONE acetate (PRED FORTE) 1 % ophthalmic suspension 1 drop 4 (four) times daily.   . sildenafil (VIAGRA) 100 MG tablet TAKE 1 TABLET BY MOUTH EVERY DAY AS NEEDED  . tamsulosin (FLOMAX) 0.4 MG CAPS capsule Take 1 capsule (0.4 mg total) by mouth daily.   No facility-administered encounter medications on file as of 10/03/2020.   Active Ambulatory Problems    Diagnosis Date Noted  . Thyrotoxicosis 06/29/2007  . HYPOGONADISM 08/15/2008  . HYPERCHOLESTEROLEMIA 02/13/2010  . Essential hypertension 06/29/2007  . Diabetes ([place]) 07/27/2014  . Urolithiasis 12/19/2014  . Graves' disease 10/03/2020  . Polyp of colon 10/03/2020  . 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 Unspecified disorder of liver 02/13/2010  . Encounter for long-term (current) use of other medications 06/01/2012  . Screening for prostate cancer 06/01/2012  . Routine general medical examination at a health care facility 12/01/2012  . Hematuria 07/27/2014  . Screening for cancer 07/27/2014  . Foot pain, left 03/07/2019   Past Medical History:  Diagnosis Date  . Adenoma of colon   . Cataract   . Chronic kidney disease   . Diabetes mellitus without complication ([place])   . HTN (hypertension)   . Hyperlipidemia   . Hyperthyroidism    Past Medical History:  Diagnosis Date  . Adenoma of colon   . Cataract    forming   . Chronic kidney disease    left upper pole stone  . Diabetes mellitus without complication ([facility])    diet controlled- pt states uses lants PRN only- no shot in 8 months  . Encounter for long-term (current) use of other medications 06/01/2012  . Foot pain, left 03/07/2019  . Hematuria 07/27/2014  . HTN (hypertension)   . Hyperlipidemia   . Hyperthyroidism   . Routine general medical examination at a health care facility 12/01/2012  . Screening for cancer 07/27/2014  . Screening for prostate cancer 06/01/2012  . Unspecified disorder of liver 02/13/2010   Qualifier: Diagnosis of  By: Loanne Drilling  MD, Jacelyn Pi    Family History  Problem Relation Age of Onset  . Throat cancer Mother        heavy smoker   . Esophageal cancer Mother   . Other Son        Committed Suicide  . Colon cancer Neg Hx   . Colon polyps Neg Hx   . Rectal cancer Neg Hx   . Stomach cancer Neg Hx    Past Surgical History:  Procedure Laterality Date  . COLONOSCOPY    . HERNIA REPAIR    . KIDNEY STONE SURGERY    . POLYPECTOMY    . TONSILLECTOMY    . VASECTOMY     Social History   Tobacco Use  . Smoking status: Never Smoker  . Smokeless tobacco: Former Systems developer    Types: Chew  Substance Use Topics  . Alcohol use: No    Alcohol/week: 0.0 standard drinks  . Drug use: No   Review of Systems Per HPI unless  specifically indicated above     Objective:    BP 110/84   Pulse 72   Temp (!) 97.4 F (36.3 C)   Ht 5\' 7"  (1.702 m)   Wt 162 lb 3.2 oz (73.6 kg)   BMI 25.40 kg/m   Wt Readings from Last 3 Encounters:  10/03/20 162 lb 3.2 oz (73.6 kg)  07/17/20 164 lb (74.4 kg)  03/13/20 168 lb (76.2 kg)    Physical Exam Vitals and nursing note reviewed.  Constitutional:      General: He is not in acute distress.    Appearance: Normal appearance. He is not toxic-appearing.  HENT:     Head: Normocephalic and atraumatic.     Right Ear: Tympanic membrane, ear canal and external ear normal.     Left Ear: Tympanic membrane, ear canal and external ear normal.     Nose: Nose normal. No congestion.     Mouth/Throat:     Mouth: Mucous membranes are moist.     Pharynx: Oropharynx is clear. No oropharyngeal exudate or posterior oropharyngeal erythema.  Eyes:     General: No scleral icterus.    Extraocular Movements: Extraocular movements intact.  Neck:     Vascular: No carotid bruit.  Cardiovascular:     Rate and Rhythm: Normal rate.     Heart sounds: Normal heart sounds. No murmur heard.   Pulmonary:     Effort: Pulmonary effort is normal. No respiratory distress.     Breath sounds: Normal breath  sounds. No wheezing or rhonchi.  Abdominal:     General: Abdomen is flat. Bowel sounds are normal. There is no distension.  Musculoskeletal:        General: Normal range of motion.     Cervical back: Normal range of motion.     Right lower leg: No edema.     Left lower leg: No edema.  Skin:    General: Skin is warm and dry.     Capillary Refill: Capillary refill takes less than 2 seconds.     Coloration: Skin is not jaundiced or pale.     Findings: No erythema.  Neurological:     Mental Status: He is alert and oriented to person, place, and time.     Motor: No weakness.     Gait: Gait normal.  Psychiatric:        Mood and Affect: Mood normal.        Behavior: Behavior normal.        Thought Content: Thought content normal.        Judgment: Judgment normal.       Assessment & Plan:   Problem List Items Addressed This Visit      Cardiovascular and Mediastinum   Essential hypertension - Primary    Chronic.  Blood pressure at goal today in clinic.  Continue lisinopril-hydrochlorothiazide 20-25 mg daily.  Blood counts and metabolic panel with electrolytes and kidney function labs ordered-patient to return when he is fasting.  Follow-up in 6 months.      Relevant Orders   CBC with Differential   COMPLETE METABOLIC PANEL WITH GFR     Endocrine   Graves' disease    Follows closely with endocrinology-continue collaboration.      Diabetes ([place])    Chronic.  Follows closely with endocrinology-continue this collaboration.      Relevant Orders   Lipid Panel   CBC with Differential   COMPLETE METABOLIC PANEL WITH GFR     Other  HYPERCHOLESTEROLEMIA    Chronic.  Likely secondary to diabetes.  Patient is already on low-dose statin-continue this medication.  We will check liver function and lipid panel when patient returns for fasting labs.  Follow-up 6 months.       Other Visit Diagnoses    Screening for prostate cancer       Relevant Orders   PSA       Follow up  plan: Return in about 6 months (around 04/05/2021).

## 2020-10-03 NOTE — Assessment & Plan Note (Signed)
Chronic.  Blood pressure at goal today in clinic.  Continue lisinopril-hydrochlorothiazide 20-25 mg daily.  Blood counts and metabolic panel with electrolytes and kidney function labs ordered-patient to return when he is fasting.  Follow-up in 6 months.

## 2020-10-04 ENCOUNTER — Other Ambulatory Visit: Payer: 59

## 2020-10-04 ENCOUNTER — Other Ambulatory Visit: Payer: Self-pay | Admitting: Endocrinology

## 2020-10-04 ENCOUNTER — Other Ambulatory Visit: Payer: Self-pay

## 2020-10-04 DIAGNOSIS — E781 Pure hyperglyceridemia: Secondary | ICD-10-CM

## 2020-10-04 DIAGNOSIS — I10 Essential (primary) hypertension: Secondary | ICD-10-CM

## 2020-10-04 DIAGNOSIS — E119 Type 2 diabetes mellitus without complications: Secondary | ICD-10-CM

## 2020-10-04 DIAGNOSIS — Z125 Encounter for screening for malignant neoplasm of prostate: Secondary | ICD-10-CM

## 2020-10-05 ENCOUNTER — Encounter: Payer: Self-pay | Admitting: Nurse Practitioner

## 2020-10-05 LAB — CBC WITH DIFFERENTIAL/PLATELET
Absolute Monocytes: 713 cells/uL (ref 200–950)
Basophils Absolute: 41 cells/uL (ref 0–200)
Basophils Relative: 0.5 %
Eosinophils Absolute: 219 cells/uL (ref 15–500)
Eosinophils Relative: 2.7 %
HCT: 45.5 % (ref 38.5–50.0)
Hemoglobin: 15.4 g/dL (ref 13.2–17.1)
Lymphs Abs: 2576 cells/uL (ref 850–3900)
MCH: 30.7 pg (ref 27.0–33.0)
MCHC: 33.8 g/dL (ref 32.0–36.0)
MCV: 90.6 fL (ref 80.0–100.0)
MPV: 9.9 fL (ref 7.5–12.5)
Monocytes Relative: 8.8 %
Neutro Abs: 4552 cells/uL (ref 1500–7800)
Neutrophils Relative %: 56.2 %
Platelets: 187 10*3/uL (ref 140–400)
RBC: 5.02 10*6/uL (ref 4.20–5.80)
RDW: 12.5 % (ref 11.0–15.0)
Total Lymphocyte: 31.8 %
WBC: 8.1 10*3/uL (ref 3.8–10.8)

## 2020-10-05 LAB — COMPLETE METABOLIC PANEL WITH GFR
AG Ratio: 1.8 (calc) (ref 1.0–2.5)
ALT: 16 U/L (ref 9–46)
AST: 16 U/L (ref 10–35)
Albumin: 4.3 g/dL (ref 3.6–5.1)
Alkaline phosphatase (APISO): 74 U/L (ref 35–144)
BUN/Creatinine Ratio: 19 (calc) (ref 6–22)
BUN: 25 mg/dL (ref 7–25)
CO2: 26 mmol/L (ref 20–32)
Calcium: 9.5 mg/dL (ref 8.6–10.3)
Chloride: 99 mmol/L (ref 98–110)
Creat: 1.31 mg/dL — ABNORMAL HIGH (ref 0.70–1.25)
GFR, Est African American: 66 mL/min/{1.73_m2} (ref >=60)
GFR, Est Non African American: 57 mL/min/{1.73_m2} — ABNORMAL LOW (ref >=60)
Globulin: 2.4 g/dL (calc) (ref 1.9–3.7)
Glucose, Bld: 108 mg/dL — ABNORMAL HIGH (ref 65–99)
Potassium: 4.4 mmol/L (ref 3.5–5.3)
Sodium: 137 mmol/L (ref 135–146)
Total Bilirubin: 0.5 mg/dL (ref 0.2–1.2)
Total Protein: 6.7 g/dL (ref 6.1–8.1)

## 2020-10-05 LAB — PSA: PSA: 0.82 ng/mL (ref <=4.00)

## 2020-10-05 LAB — LIPID PANEL
Cholesterol: 199 mg/dL (ref <200)
HDL: 47 mg/dL (ref >=40)
LDL Cholesterol (Calc): 118 mg/dL (calc) — ABNORMAL HIGH
Non-HDL Cholesterol (Calc): 152 mg/dL (calc) — ABNORMAL HIGH (ref <130)
Total CHOL/HDL Ratio: 4.2 (calc) (ref <5.0)
Triglycerides: 220 mg/dL — ABNORMAL HIGH (ref <150)

## 2020-10-15 ENCOUNTER — Other Ambulatory Visit: Payer: Self-pay | Admitting: *Deleted

## 2020-10-15 DIAGNOSIS — E860 Dehydration: Secondary | ICD-10-CM

## 2020-10-18 ENCOUNTER — Other Ambulatory Visit: Payer: 59

## 2020-10-18 DIAGNOSIS — R944 Abnormal results of kidney function studies: Secondary | ICD-10-CM

## 2020-10-19 LAB — BASIC METABOLIC PANEL WITH GFR
BUN/Creatinine Ratio: 15 (calc) (ref 6–22)
BUN: 24 mg/dL (ref 7–25)
CO2: 28 mmol/L (ref 20–32)
Calcium: 10 mg/dL (ref 8.6–10.3)
Chloride: 102 mmol/L (ref 98–110)
Creat: 1.56 mg/dL — ABNORMAL HIGH (ref 0.70–1.25)
GFR, Est African American: 54 mL/min/{1.73_m2} — ABNORMAL LOW (ref >=60)
GFR, Est Non African American: 46 mL/min/{1.73_m2} — ABNORMAL LOW (ref >=60)
Glucose, Bld: 75 mg/dL (ref 65–99)
Potassium: 4.5 mmol/L (ref 3.5–5.3)
Sodium: 141 mmol/L (ref 135–146)

## 2020-10-22 ENCOUNTER — Other Ambulatory Visit: Payer: Self-pay

## 2020-10-22 ENCOUNTER — Telehealth: Payer: Self-pay

## 2020-10-22 DIAGNOSIS — E7800 Pure hypercholesterolemia, unspecified: Secondary | ICD-10-CM

## 2020-10-22 MED ORDER — ATORVASTATIN CALCIUM 20 MG PO TABS: 20.0000 mg | ORAL_TABLET | Freq: Every day | ORAL | 3 refills | Status: DC

## 2020-10-22 NOTE — Telephone Encounter (Signed)
Per pc

## 2020-10-25 ENCOUNTER — Other Ambulatory Visit: Payer: 59

## 2020-10-25 ENCOUNTER — Other Ambulatory Visit: Payer: Self-pay

## 2020-10-25 DIAGNOSIS — R944 Abnormal results of kidney function studies: Secondary | ICD-10-CM

## 2020-10-25 LAB — URINALYSIS, ROUTINE W REFLEX MICROSCOPIC
Bilirubin Urine: NEGATIVE
Glucose, UA: NEGATIVE
Hgb urine dipstick: NEGATIVE
Ketones, ur: NEGATIVE
Leukocytes,Ua: NEGATIVE
Nitrite: NEGATIVE
Protein, ur: NEGATIVE
Specific Gravity, Urine: 1.001 (ref 1.001–1.035)
pH: 6 (ref 5.0–8.0)

## 2020-10-26 NOTE — Progress Notes (Signed)
Spoke with wife, will relay message

## 2020-11-02 ENCOUNTER — Ambulatory Visit
Admission: RE | Admit: 2020-11-02 | Discharge: 2020-11-02 | Disposition: A | Discharge status: Home / Self Care | Payer: 59 | Source: Ambulatory Visit | Attending: Nurse Practitioner | Admitting: Nurse Practitioner

## 2020-11-08 NOTE — Progress Notes (Signed)
Called pt,vm for [place] results

## 2020-11-16 ENCOUNTER — Telehealth: Payer: Self-pay

## 2020-11-16 ENCOUNTER — Telehealth: Payer: Self-pay | Admitting: Nurse Practitioner

## 2020-11-16 NOTE — Telephone Encounter (Signed)
Pt called in wanting to know if NP has received any info from his visit to the urologist that he was referred to. Pt stated that he has called twice and lvm. He just wanted to know if there was anything that he should be aware of since his appt. Please advise   Cb: [phone]

## 2020-11-16 NOTE — Telephone Encounter (Signed)
Spoke with pt regarding imaging, will follow up at nx ov

## 2020-11-16 NOTE — Telephone Encounter (Signed)
Lmfcb

## 2020-11-20 ENCOUNTER — Other Ambulatory Visit: Payer: Self-pay

## 2020-11-20 ENCOUNTER — Encounter: Payer: Self-pay | Admitting: Endocrinology

## 2020-11-20 ENCOUNTER — Ambulatory Visit: Payer: 59 | Admitting: Endocrinology

## 2020-11-20 VITALS — BP 132/78 | HR 62 | Ht 67.0 in | Wt 166.6 lb

## 2020-11-20 DIAGNOSIS — E119 Type 2 diabetes mellitus without complications: Secondary | ICD-10-CM | POA: Diagnosis not present

## 2020-11-20 LAB — T4, FREE: Free T4: 0.69 ng/dL (ref 0.60–1.60)

## 2020-11-20 LAB — TSH: TSH: 2.1 u[IU]/mL (ref 0.35–5.50)

## 2020-11-20 LAB — POCT GLYCOSYLATED HEMOGLOBIN (HGB A1C): Hemoglobin A1C: 5.9 % — AB (ref 4.0–5.6)

## 2020-11-20 NOTE — Progress Notes (Signed)
Subjective:    Patient ID: Andrew Morrow, Andrew Morrow    DOB: 1955/06/10, 65 y.o.   MRN: [id number]  HPI Pt returns for f/u of diabetes mellitus:  DM type: 2 Dx'ed: 1962 Complications: PN.   Therapy: 2 oral meds.   DKA: never.  Severe hypoglycemia: never.  Pancreatitis: never.  Other: he took insulin 2011-2014.  Interval history: He brings a record of his cbg's which I have reviewed today.  CBG varies from 76-145.   Pt also has hyperthyroidism (dx'ed 2000; he chose thionamide rx; he was off rx from 2005-2007, but it recurred, so he has been on tapazole since then; he has never had thyroid imaging).  He takes tapazole as rx'ed.  pt states he feels well in general.   Past Medical History:  Diagnosis Date   Adenoma of colon    Cataract    forming    Chronic kidney disease    left upper pole stone   Diabetes mellitus without complication ([place])    diet controlled- pt states uses lants PRN only- no shot in 8 months   Encounter for long-term (current) use of other medications 06/01/2012   Foot pain, left 03/07/2019   Hematuria 07/27/2014   HTN (hypertension)    Hyperlipidemia    Hyperthyroidism    Routine general medical examination at a health care facility 12/01/2012   Screening for cancer 07/27/2014   Screening for prostate cancer 06/01/2012   Unspecified disorder of liver 02/13/2010   Qualifier: Diagnosis of  By: Loanne Drilling MD, Jacelyn Pi     Past Surgical History:  Procedure Laterality Date   COLONOSCOPY     HERNIA REPAIR     KIDNEY STONE SURGERY     POLYPECTOMY     TONSILLECTOMY     VASECTOMY      Social History   Socioeconomic History   Marital status: Married    Spouse name: Not on file   Number of children: Not on file   Years of education: Not on file   Highest education level: Not on file  Occupational History   Not on file  Tobacco Use   Smoking status: Never   Smokeless tobacco: Former    Types: Chew  Substance and Sexual Activity   Alcohol use: No     Alcohol/week: 0.0 standard drinks   Drug use: No   Sexual activity: Not on file  Other Topics Concern   Not on file  Social History Narrative   Works as Chief Financial Officer   Married   Social Determinants of Radio broadcast assistant Strain: Not on file  Food Insecurity: Not on file  Transportation Needs: Not on file  Physical Activity: Not on file  Stress: Not on file  Social Connections: Not on file  Intimate Partner Violence: Not on file    Current Outpatient Medications on File Prior to Visit  Medication Sig Dispense Refill   acetaminophen (TYLENOL) 500 MG tablet Take 500 mg by mouth every 6 (six) hours as needed for moderate pain.     atorvastatin (LIPITOR) 20 MG tablet Take 1 tablet (20 mg total) by mouth daily. 90 tablet 3   Bisacodyl (LAXATIVE PO) Take 1 tablet by mouth daily as needed (constipation).     diclofenac (VOLTAREN) 75 MG EC tablet TAKE 1 TABLET BY MOUTH TWICE A DAY 50 tablet 2   gatifloxacin (ZYMAXID) 0.5 % SOLN 1 drop.     ketorolac (ACULAR) 0.4 % SOLN 1 drop 4 (four) times daily.  lisinopril-hydrochlorothiazide (ZESTORETIC) 20-25 MG tablet TAKE 1 TABLET BY MOUTH EVERY DAY 90 tablet 1   metFORMIN (GLUCOPHAGE-XR) 500 MG 24 hr tablet Take 3 tablets (1,500 mg total) by mouth daily with breakfast. 270 tablet 3   methimazole (TAPAZOLE) 10 MG tablet TAKE 1 TABLET BY MOUTH EVERY DAY 90 tablet 1   Multiple Vitamin (MULTIVITAMIN) tablet Take 1 tablet by mouth every morning.      ONETOUCH ULTRA test strip USE AS DIRECTED TO CHECK BLOOD SUGAR DAILY E11.9 100 strip 1   pioglitazone (ACTOS) 15 MG tablet Take 1 tablet (15 mg total) by mouth daily. 90 tablet 3   prednisoLONE acetate (PRED FORTE) 1 % ophthalmic suspension 1 drop 4 (four) times daily.     sildenafil (VIAGRA) 100 MG tablet TAKE 1 TABLET BY MOUTH EVERY DAY AS NEEDED 3 tablet 39   tamsulosin (FLOMAX) 0.4 MG CAPS capsule Take 1 capsule (0.4 mg total) by mouth daily. 30 capsule 0   No current  facility-administered medications on file prior to visit.    No Known Allergies  Family History  Problem Relation Age of Onset   Throat cancer Mother        heavy smoker    Esophageal cancer Mother    Other Son        Committed Suicide   Diabetes Mellitus I Brother    Colon cancer Neg Hx    Colon polyps Neg Hx    Rectal cancer Neg Hx    Stomach cancer Neg Hx     BP 132/78 (BP Location: Right Arm, Patient Position: Sitting, Cuff Size: Normal)   Pulse 62   Ht 5\' 7"  (1.702 m)   Wt 166 lb 9.6 oz (75.6 kg)   SpO2 99%   BMI 26.09 kg/m    Review of Systems Denies fever.      Objective:   Physical Exam Pulses: dorsalis pedis intact bilat.   MSK: no deformity of the feet CV: no leg edema Skin:  no ulcer on the feet.  normal color and temp on the feet. Neuro: sensation is intact to touch on the feet.    Lab Results  Component Value Date   HGBA1C 5.9 (A) 11/20/2020       Assessment & Plan:  Type 2 DM: well-controlled.   Hyperthyroidism: recheck today.    Patient Instructions  Blood tests are requested for you today.  We'll let you know about the results.   If ever you have fever while taking methimazole, stop it and call us, even if the reason is obvious, because of the risk of a rare side-effect.   It is best to never miss the methimazole.  However, if you do miss it, next best is to double up the next time.   Please come back for a follow-up appointment in 6 months.

## 2020-11-20 NOTE — Patient Instructions (Addendum)
Blood tests are requested for you today.  We'll let you know about the results.   If ever you have fever while taking methimazole, stop it and call us, even if the reason is obvious, because of the risk of a rare side-effect.   It is best to never miss the methimazole.  However, if you do miss it, next best is to double up the next time.   Please come back for a follow-up appointment in 6 months.

## 2020-11-24 ENCOUNTER — Other Ambulatory Visit: Payer: Self-pay | Admitting: Endocrinology

## 2020-12-06 ENCOUNTER — Other Ambulatory Visit: Payer: Self-pay | Admitting: Endocrinology

## 2020-12-19 ENCOUNTER — Other Ambulatory Visit: Payer: Self-pay | Admitting: Endocrinology

## 2021-03-06 ENCOUNTER — Other Ambulatory Visit: Payer: Self-pay | Admitting: Endocrinology

## 2021-04-08 ENCOUNTER — Other Ambulatory Visit: Payer: Self-pay | Admitting: Endocrinology

## 2021-04-08 ENCOUNTER — Ambulatory Visit: Payer: 59 | Admitting: Nurse Practitioner

## 2021-04-11 ENCOUNTER — Ambulatory Visit: Payer: 59 | Admitting: Nurse Practitioner

## 2021-04-11 ENCOUNTER — Other Ambulatory Visit: Payer: Self-pay

## 2021-04-11 ENCOUNTER — Encounter: Payer: Self-pay | Admitting: Nurse Practitioner

## 2021-04-11 VITALS — BP 140/68 | HR 64 | Temp 97.8°F | Resp 17 | Ht 67.0 in | Wt 171.6 lb

## 2021-04-11 DIAGNOSIS — E1136 Type 2 diabetes mellitus with diabetic cataract: Secondary | ICD-10-CM

## 2021-04-11 DIAGNOSIS — E7800 Pure hypercholesterolemia, unspecified: Secondary | ICD-10-CM

## 2021-04-11 DIAGNOSIS — I10 Essential (primary) hypertension: Secondary | ICD-10-CM

## 2021-04-11 DIAGNOSIS — E0500 Thyrotoxicosis with diffuse goiter without thyrotoxic crisis or storm: Secondary | ICD-10-CM | POA: Diagnosis not present

## 2021-04-11 DIAGNOSIS — Z23 Encounter for immunization: Secondary | ICD-10-CM

## 2021-04-11 NOTE — Assessment & Plan Note (Signed)
Chronic.  Blood pressure slightly high today-patient attributes to just getting off the highway and having a stressful drive.  I have asked the patient to check his blood pressure a few times per week at home and notify us with readings in 2 weeks.  Continue lisinopril-hydrochlorothiazide 20-25 mg daily for now.  We will check kidney function with electrolytes-at last check his kidney function had increased and was for an unclear reason.  Encourage plenty of hydration with water for the most part, can also drink 1 sugar-free Gatorade daily.  We will also check a urine sample today.  Follow-up 6 months.

## 2021-04-11 NOTE — Assessment & Plan Note (Signed)
Chronic.  Continue collaboration with endocrinology.

## 2021-04-11 NOTE — Assessment & Plan Note (Signed)
Chronic.  Check lipids today with liver enzymes.  Patient is not fasting, however.  LDL goal is less than 70 given history of diabetes.  Plan to continue atorvastatin 20 mg daily for now.  Follow-up 6 months.

## 2021-04-11 NOTE — Progress Notes (Signed)
Subjective:    Patient ID: Andrew Morrow, male    DOB: 1956-04-07, 65 y.o.   MRN: [id number]  HPI: Andrew Morrow is a 65 y.o. male presenting for follow up.  Chief Complaint  Patient presents with   Hypertension    Follow up   HYPERTENSION / HYPERLIPIDEMIA Patient is currently taking lisinopril-hydrochlorothiazide 20-25 mg daily for blood pressure.  He is taking atorvastatin 20 mg daily for cholesterol.  He is not fasting today. BP monitoring frequency: rarely Home BP: not checking Aspirin: no Recent stressors: yes Recurrent headaches: no Visual changes: no Palpitations: no Dyspnea: no Chest pain: no Lower extremity edema: no Dizzy/lightheaded: no Myalgias: no LDL goal: less than 70 BP goal: Less than 130/80 The 10-year ASCVD risk score (Arnett DK, et al., 2019) is: 39.5%   Values used to calculate the score:     Age: 48 years     Sex: Male     Is Non-Hispanic African American: No     Diabetic: Yes     Tobacco smoker: Yes     Systolic Blood Pressure: 676 mmHg     Is BP treated: Yes     HDL Cholesterol: 47 mg/dL     Total Cholesterol: 199 mg/dL  Diet: eats meat/potatoes, eats some greens Physical activity: 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 Water: drinks gatorade mostly; 1-2 bottles of water daily  Today, patient reports he does not like to take daily medications because he does not want his body to depend on them.  He will take some daily medications, but is weary to add on more for this reason.  He states he realizes that this may not be the best mindset.  Patient continues to follow with endocrinology for diabetes and hyperthyroidism.  He maintains on metformin 1500 milligrams daily, pioglitazone 15 mg daily, Tapazole 10 mg daily.  No Known Allergies  Outpatient Encounter Medications as of 04/11/2021  Medication Sig   atorvastatin (LIPITOR) 20 MG tablet Take 1 tablet (20 mg total) by mouth daily.    lisinopril-hydrochlorothiazide (ZESTORETIC) 20-25 MG tablet TAKE 1 TABLET BY MOUTH EVERY DAY   metFORMIN (GLUCOPHAGE-XR) 500 MG 24 hr tablet TAKE 3 TABLETS (1,500 MG TOTAL) BY MOUTH DAILY WITH BREAKFAST.   methimazole (TAPAZOLE) 10 MG tablet TAKE 1 TABLET BY MOUTH EVERY DAY   Multiple Vitamin (MULTIVITAMIN) tablet Take 1 tablet by mouth every morning.    ONETOUCH ULTRA test strip USE AS DIRECTED   pioglitazone (ACTOS) 15 MG tablet TAKE 1 TABLET BY MOUTH EVERY DAY   sildenafil (VIAGRA) 100 MG tablet TAKE 1 TABLET BY MOUTH EVERY DAY AS NEEDED   acetaminophen (TYLENOL) 500 MG tablet Take 500 mg by mouth every 6 (six) hours as needed for moderate pain.   [DISCONTINUED] Bisacodyl (LAXATIVE PO) Take 1 tablet by mouth daily as needed (constipation). (Patient not taking: Reported on 04/11/2021)   [DISCONTINUED] diclofenac (VOLTAREN) 75 MG EC tablet TAKE 1 TABLET BY MOUTH TWICE A DAY (Patient not taking: Reported on 04/11/2021)   [DISCONTINUED] gatifloxacin (ZYMAXID) 0.5 % SOLN 1 drop. (Patient not taking: Reported on 04/11/2021)   [DISCONTINUED] ketorolac (ACULAR) 0.4 % SOLN 1 drop 4 (four) times daily. (Patient not taking: Reported on 04/11/2021)   [DISCONTINUED] prednisoLONE acetate (PRED FORTE) 1 % ophthalmic suspension 1 drop 4 (four) times daily.   [DISCONTINUED] tamsulosin (FLOMAX) 0.4 MG CAPS capsule Take 1 capsule (0.4 mg total) by mouth daily. (Patient not taking: Reported on  04/11/2021)   No facility-administered encounter medications on file as of 04/11/2021.    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 10/03/2020   Urolithiasis 12/19/2014   Diabetes ([place]) 07/27/2014   HYPERCHOLESTEROLEMIA 02/13/2010   HYPOGONADISM 08/15/2008   Thyrotoxicosis 06/29/2007   Essential hypertension 06/29/2007    Past Medical History:  Diagnosis Date   Adenoma of colon     Cataract    forming    Chronic kidney disease    left upper pole stone   Diabetes mellitus without complication ([place])    diet controlled- pt states uses lants PRN only- no shot in 8 months   Encounter for long-term (current) use of other medications 06/01/2012   Foot pain, left 03/07/2019   Hematuria 07/27/2014   HTN (hypertension)    Hyperlipidemia    Hyperthyroidism    Routine general medical examination at a health care facility 12/01/2012   Screening for cancer 07/27/2014   Screening for prostate cancer 06/01/2012   Unspecified disorder of liver 02/13/2010   Qualifier: Diagnosis of  By: Loanne Drilling MD, Jacelyn Pi     Relevant past medical, surgical, family and social history reviewed and updated as indicated. Interim medical history since our last visit reviewed.  Review of Systems Per HPI unless specifically indicated above     Objective:    BP 140/68   Pulse 64   Temp 97.8 F (36.6 C)   Resp 17   Ht 5\' 7"  (1.702 m)   Wt 171 lb 9.6 oz (77.8 kg)   SpO2 99%   BMI 26.88 kg/m   Wt Readings from Last 3 Encounters:  04/11/21 171 lb 9.6 oz (77.8 kg)  11/20/20 166 lb 9.6 oz (75.6 kg)  10/03/20 162 lb 3.2 oz (73.6 kg)    Physical Exam Constitutional:      General: He is not in acute distress.    Appearance: Normal appearance. He is not toxic-appearing.  HENT:     Head: Normocephalic and atraumatic.     Mouth/Throat:     Pharynx: No oropharyngeal exudate.  Eyes:     General: No scleral icterus.       Right eye: No discharge.        Left eye: No discharge.     Extraocular Movements: Extraocular movements intact.     Pupils: Pupils are equal, round, and reactive to light.  Neck:     Vascular: No carotid bruit.  Cardiovascular:     Rate and Rhythm: Normal rate and regular rhythm.     Heart sounds: Normal heart sounds. No murmur heard.   No gallop.  Pulmonary:     Effort: Pulmonary effort is normal. No respiratory distress.     Breath sounds: Normal breath sounds. No wheezing  or rhonchi.  Abdominal:     General: Abdomen is flat. Bowel sounds are normal. There is no distension.     Palpations: Abdomen is soft.     Tenderness: There is no abdominal tenderness.  Musculoskeletal:        General: Normal range of motion.     Cervical back: Normal range of motion and neck supple. No tenderness.     Right lower leg: No edema.     Left lower leg: No edema.  Lymphadenopathy:     Cervical: No cervical adenopathy.  Skin:    General: Skin is warm  and dry.     Coloration: Skin is not jaundiced or pale.     Findings: No erythema.  Neurological:     Mental Status: He is alert and oriented to person, place, and time.     Motor: No weakness.     Gait: Gait normal.  Psychiatric:        Mood and Affect: Mood normal.        Behavior: Behavior normal.        Thought Content: Thought content normal.        Judgment: Judgment normal.      Assessment & Plan:   Problem List Items Addressed This Visit       Cardiovascular and Mediastinum   Essential hypertension    Chronic.  Blood pressure slightly high today-patient attributes to just getting off the highway and having a stressful drive.  I have asked the patient to check his blood pressure a few times per week at home and notify us with readings in 2 weeks.  Continue lisinopril-hydrochlorothiazide 20-25 mg daily for now.  We will check kidney function with electrolytes-at last check his kidney function had increased and was for an unclear reason.  Encourage plenty of hydration with water for the most part, can also drink 1 sugar-free Gatorade daily.  We will also check a urine sample today.  Follow-up 6 months.      Relevant Orders   BASIC METABOLIC PANEL WITH GFR   Microalbumin, urine     Endocrine   Graves' disease    Chronic.  Continue collaboration with endocrinology.      Diabetes ([place])    Chronic.  Continue collaboration with endocrinology.        Other   HYPERCHOLESTEROLEMIA - Primary    Chronic.  Check  lipids today with liver enzymes.  Patient is not fasting, however.  LDL goal is less than 70 given history of diabetes.  Plan to continue atorvastatin 20 mg daily for now.  Follow-up 6 months.      Relevant Orders   Lipid panel    If patient's kidney function comes back again acutely worsened, we may need to rethink his regimen for both diabetes and blood pressure.  It does not seem like he is willing to change much on his dietary changes.  He is willing to get the COVID booster and I did advise this.  I also recommended pneumonia vaccine, however he wishes to hold off on this for now.  Follow up plan: Return in about 6 months (around 10/10/2021) for follow up.

## 2021-04-12 DIAGNOSIS — Z23 Encounter for immunization: Secondary | ICD-10-CM

## 2021-04-12 LAB — LIPID PANEL
Cholesterol: 159 mg/dL (ref <200)
HDL: 48 mg/dL (ref >=40)
LDL Cholesterol (Calc): 85 mg/dL (calc)
Non-HDL Cholesterol (Calc): 111 mg/dL (calc) (ref <130)
Total CHOL/HDL Ratio: 3.3 (calc) (ref <5.0)
Triglycerides: 164 mg/dL — ABNORMAL HIGH (ref <150)

## 2021-04-12 LAB — BASIC METABOLIC PANEL WITH GFR
BUN: 19 mg/dL (ref 7–25)
CO2: 32 mmol/L (ref 20–32)
Calcium: 10.4 mg/dL — ABNORMAL HIGH (ref 8.6–10.3)
Chloride: 99 mmol/L (ref 98–110)
Creat: 1.2 mg/dL (ref 0.70–1.35)
Glucose, Bld: 87 mg/dL (ref 65–99)
Potassium: 4.3 mmol/L (ref 3.5–5.3)
Sodium: 141 mmol/L (ref 135–146)
eGFR: 68 mL/min/{1.73_m2} (ref >=60)

## 2021-04-12 LAB — MICROALBUMIN, URINE: Microalb, Ur: <0.2 mg/dL

## 2021-04-16 ENCOUNTER — Encounter: Payer: Self-pay | Admitting: Internal Medicine

## 2021-04-17 NOTE — Addendum Note (Signed)
Addended by: Noemi Chapel A on: 04/17/2021 10:18 AM   Modules accepted: Orders

## 2021-04-19 MED ORDER — ATORVASTATIN CALCIUM 40 MG PO TABS: 40.0000 mg | ORAL_TABLET | Freq: Every day | ORAL | 3 refills | Status: DC

## 2021-04-19 MED ORDER — LISINOPRIL-HYDROCHLOROTHIAZIDE 20-25 MG PO TABS: 1.0000 | ORAL_TABLET | Freq: Every day | ORAL | 1 refills | Status: DC

## 2021-04-19 NOTE — Addendum Note (Signed)
Addended by: Jaynie Crumble on: 04/19/2021 04:58 PM   Modules accepted: Orders

## 2021-04-30 LAB — HM DIABETES EYE EXAM

## 2021-05-19 ENCOUNTER — Other Ambulatory Visit: Payer: Self-pay | Admitting: Endocrinology

## 2021-05-23 ENCOUNTER — Ambulatory Visit: Payer: 59 | Admitting: Endocrinology

## 2021-05-24 ENCOUNTER — Ambulatory Visit: Payer: 59 | Admitting: Endocrinology

## 2021-05-24 ENCOUNTER — Telehealth: Payer: Self-pay

## 2021-05-24 ENCOUNTER — Other Ambulatory Visit: Payer: Self-pay

## 2021-05-24 VITALS — BP 140/64 | HR 62 | Ht 67.0 in | Wt 172.4 lb

## 2021-05-24 DIAGNOSIS — E0580 Other thyrotoxicosis without thyrotoxic crisis or storm: Secondary | ICD-10-CM

## 2021-05-24 DIAGNOSIS — E119 Type 2 diabetes mellitus without complications: Secondary | ICD-10-CM

## 2021-05-24 LAB — TSH: TSH: 3.22 u[IU]/mL (ref 0.35–5.50)

## 2021-05-24 LAB — POCT GLYCOSYLATED HEMOGLOBIN (HGB A1C): Hemoglobin A1C: 6.1 % — AB (ref 4.0–5.6)

## 2021-05-24 LAB — T4, FREE: Free T4: 0.79 ng/dL (ref 0.60–1.60)

## 2021-05-24 MED ORDER — METFORMIN HCL ER 500 MG PO TB24: 1500.0000 mg | ORAL_TABLET | Freq: Every day | ORAL | 3 refills | Status: DC

## 2021-05-24 NOTE — Telephone Encounter (Signed)
-----   Message from Renato Shin, MD sent at 05/24/2021 12:44 PM EST ----- please contact patient: Normal.  Please continue the same methimazole.  I'll see you next time.

## 2021-05-24 NOTE — Telephone Encounter (Signed)
Patient has now been informed of his lab results, expressed understanding.

## 2021-05-24 NOTE — Patient Instructions (Signed)
Blood tests are requested for you today.  We'll let you know about the results.   If ever you have fever while taking methimazole, stop it and call us, even if the reason is obvious, because of the risk of a rare side-effect.   It is best to never miss the methimazole.  However, if you do miss it, next best is to double up the next time.   Please come back for a follow-up appointment in 6 months.

## 2021-05-24 NOTE — Progress Notes (Signed)
Subjective:    Patient ID: Andrew Morrow, male    DOB: 1955-12-19, 66 y.o.   MRN: [id number]  HPI Pt returns for f/u of diabetes mellitus:  DM type: 2 Dx'ed: 1751 Complications: PN.   Therapy: 2 oral meds.   DKA: never.  Severe hypoglycemia: never.   Pancreatitis: never.  Other: he took insulin 2011-2014.  Interval history: he tolerates metformin well.  He says cbg's are well-controlled.   Pt also has hyperthyroidism (dx'ed 2000; he chose thionamide rx; he was off rx from 2005-2007, but it recurred, so he has been on tapazole since then; he has never had thyroid imaging).  He takes tapazole as rx'ed.  pt states he feels well in general.   Past Medical History:  Diagnosis Date   Adenoma of colon    Cataract    forming    Chronic kidney disease    left upper pole stone   Diabetes mellitus without complication ([place])    diet controlled- pt states uses lants PRN only- no shot in 8 months   Encounter for long-term (current) use of other medications 06/01/2012   Foot pain, left 03/07/2019   Hematuria 07/27/2014   HTN (hypertension)    Hyperlipidemia    Hyperthyroidism    Routine general medical examination at a health care facility 12/01/2012   Screening for cancer 07/27/2014   Screening for prostate cancer 06/01/2012   Unspecified disorder of liver 02/13/2010   Qualifier: Diagnosis of  By: Loanne Drilling MD, Jacelyn Pi     Past Surgical History:  Procedure Laterality Date   COLONOSCOPY     HERNIA REPAIR     KIDNEY STONE SURGERY     POLYPECTOMY     TONSILLECTOMY     VASECTOMY      Social History   Socioeconomic History   Marital status: Married    Spouse name: Not on file   Number of children: Not on file   Years of education: Not on file   Highest education level: Not on file  Occupational History   Not on file  Tobacco Use   Smoking status: Never   Smokeless tobacco: Former    Types: Chew  Substance and Sexual Activity   Alcohol use: No    Alcohol/week: 0.0 standard  drinks   Drug use: No   Sexual activity: Not on file  Other Topics Concern   Not on file  Social History Narrative   Works as Chief Financial Officer   Married   Social Determinants of Radio broadcast assistant Strain: Not on file  Food Insecurity: Not on file  Transportation Needs: Not on file  Physical Activity: Not on file  Stress: Not on file  Social Connections: Not on file  Intimate Partner Violence: Not on file    Current Outpatient Medications on File Prior to Visit  Medication Sig Dispense Refill   acetaminophen (TYLENOL) 500 MG tablet Take 500 mg by mouth every 6 (six) hours as needed for moderate pain.     atorvastatin (LIPITOR) 20 MG tablet Take 1 tablet (20 mg total) by mouth daily. 90 tablet 3   atorvastatin (LIPITOR) 40 MG tablet Take 1 tablet (40 mg total) by mouth daily. 90 tablet 3   lisinopril-hydrochlorothiazide (ZESTORETIC) 20-25 MG tablet Take 1 tablet by mouth daily. 90 tablet 1   methimazole (TAPAZOLE) 10 MG tablet TAKE 1 TABLET BY MOUTH EVERY DAY 90 tablet 1   Multiple Vitamin (MULTIVITAMIN) tablet Take 1 tablet by mouth every morning.  ONETOUCH ULTRA test strip USE AS DIRECTED 100 strip 1   pioglitazone (ACTOS) 15 MG tablet TAKE 1 TABLET BY MOUTH EVERY DAY 90 tablet 3   sildenafil (VIAGRA) 100 MG tablet TAKE 1 TABLET BY MOUTH EVERY DAY AS NEEDED 3 tablet 39   No current facility-administered medications on file prior to visit.    No Known Allergies  Family History  Problem Relation Age of Onset   Throat cancer Mother        heavy smoker    Esophageal cancer Mother    Other Son        Committed Suicide   Diabetes Mellitus I Brother    Colon cancer Neg Hx    Colon polyps Neg Hx    Rectal cancer Neg Hx    Stomach cancer Neg Hx     BP 140/64    Pulse 62    Ht 5\' 7"  (1.702 m)    Wt 172 lb 6.4 oz (78.2 kg)    SpO2 99%    BMI 27.00 kg/m    Review of Systems     Objective:   Physical Exam  Lab Results  Component Value Date   TSH  2.10 11/20/2020    Lab Results  Component Value Date   CREATININE 1.20 04/11/2021   BUN 19 04/11/2021   NA 141 04/11/2021   K 4.3 04/11/2021   CL 99 04/11/2021   CO2 32 04/11/2021   Lab Results  Component Value Date   HGBA1C 6.1 (A) 05/24/2021      Assessment & Plan:  Type 2 DM: well-controlled.  Please continue the same 2 DM medications Hyperthyroidism: recheck today.   Patient Instructions  Blood tests are requested for you today.  We'll let you know about the results.   If ever you have fever while taking methimazole, stop it and call us, even if the reason is obvious, because of the risk of a rare side-effect.   It is best to never miss the methimazole.  However, if you do miss it, next best is to double up the next time.   Please come back for a follow-up appointment in 6 months.

## 2021-05-24 NOTE — Telephone Encounter (Signed)
Attempted to contact the patient regarding lab results, LVM for a call back.

## 2021-06-04 ENCOUNTER — Ambulatory Visit: Payer: 59 | Admitting: Orthopaedic Surgery

## 2021-06-05 ENCOUNTER — Ambulatory Visit (INDEPENDENT_AMBULATORY_CARE_PROVIDER_SITE_OTHER): Payer: No Typology Code available for payment source

## 2021-06-05 ENCOUNTER — Encounter: Payer: Self-pay | Admitting: Orthopaedic Surgery

## 2021-06-05 ENCOUNTER — Other Ambulatory Visit: Payer: Self-pay

## 2021-06-05 ENCOUNTER — Ambulatory Visit (INDEPENDENT_AMBULATORY_CARE_PROVIDER_SITE_OTHER): Payer: No Typology Code available for payment source | Admitting: Orthopaedic Surgery

## 2021-06-05 DIAGNOSIS — M25521 Pain in right elbow: Secondary | ICD-10-CM

## 2021-06-05 NOTE — Progress Notes (Signed)
Office Visit Note   Patient: Andrew Morrow           Date of Birth: 22-Feb-1956           MRN: [id number] Visit Date: 06/05/2021              Requested by: Eulogio Bear, NP [address]  [place] [address] PCP: Eulogio Bear, NP   Assessment & Plan: Visit Diagnoses:  1. Pain in right elbow     Plan: Impression is right elbow contusion/arthritis flareup from recent Workmen's Compensation injury.  At this point, I do not believe the nonunion of the capitellar fracture is symptomatic.  I believe the patient's lack of range of motion is coming from the stiffness acquired from wearing the long-arm splint over the past 3 to 4 weeks.  We will have him discontinue wearing the splint.  I would like to start him in occupational therapy to regain his range of motion.  We will also provide him with a work note to continue with light duty for the next 4 weeks.  Follow-up with [place] in 4 weeks time for recheck.  Call with concerns or questions in the meantime.  Follow-Up Instructions: Return in about 4 weeks (around 07/03/2021).   Orders:  Orders Placed This Encounter  Procedures   XR Elbow Complete Right (3+View)   Ambulatory referral to Physical Therapy   No orders of the defined types were placed in this encounter.     Procedures: No procedures performed   Clinical Data: No additional findings.   Subjective: Chief Complaint  Patient presents with   Right Elbow - Pain    HPI patient is a pleasant 66 year old right-hand-dominant gentleman who comes in today following up work comp injury from 05/14/2021.  He was coming down a ladder while at work when he hit the back of his right elbow on a hard object.  He had initial pain and swelling and was seen at [facility].  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  He was placed in a long-arm splint and has  been working light duty since.  He has not had any pain for the past 2-1/2 weeks.  His only complaint is that his elbow has become somewhat stiff specifically with extension.  He denies any paresthesias.  Review of Systems  Constitutional: Negative.   All other systems reviewed and are negative. as detailed in HPI.  All others reviewed and are negative.   Objective: Vital Signs: There were no vitals taken for this visit.  Physical Exam Vitals and nursing note reviewed.  Constitutional:      Appearance: He is well-developed.  HENT:     Head: Normocephalic and atraumatic.  Eyes:     Pupils: Pupils are equal, round, and reactive to light.  Pulmonary:     Effort: Pulmonary effort is normal.  Abdominal:     Palpations: Abdomen is soft.  Musculoskeletal:        General: Normal range of motion.     Cervical back: Neck supple.  Skin:    General: Skin is warm.  Neurological:     Mental Status: He is alert and oriented to person, place, and time.  Psychiatric:        Behavior: Behavior normal.        Thought Content: Thought content normal.  Judgment: Judgment normal.   well-developed well-nourished gentleman in no acute distress.  Alert and oriented x3.  Ortho Examexamination of the right elbow reveals no bony tenderness.  No swelling.  No skin changes.  He lacks approximately 10 degrees of flexion and 20 degrees of extension.  He is neurovascular intact distally.  Specialty Comments:  No specialty comments available.  Imaging: No results found.   PMFS History: Patient Active Problem List   Diagnosis Date Noted   Graves' disease 10/03/2020   Polyp of colon 10/03/2020   Urolithiasis 12/19/2014   Diabetes ([place]) 07/27/2014   HYPERCHOLESTEROLEMIA 02/13/2010   HYPOGONADISM 08/15/2008   Thyrotoxicosis 06/29/2007   Essential hypertension 06/29/2007   Past Medical History:  Diagnosis Date   Adenoma of colon    Cataract    forming    Chronic kidney disease    left  upper pole stone   Diabetes mellitus without complication ([place])    diet controlled- pt states uses lants PRN only- no shot in 8 months   Encounter for long-term (current) use of other medications 06/01/2012   Foot pain, left 03/07/2019   Hematuria 07/27/2014   HTN (hypertension)    Hyperlipidemia    Hyperthyroidism    Routine general medical examination at a health care facility 12/01/2012   Screening for cancer 07/27/2014   Screening for prostate cancer 06/01/2012   Unspecified disorder of liver 02/13/2010   Qualifier: Diagnosis of  By: Loanne Drilling MD, Jacelyn Pi     Family History  Problem Relation Age of Onset   Throat cancer Mother        heavy smoker    Esophageal cancer Mother    Other Son        Committed Suicide   Diabetes Mellitus I Brother    Colon cancer Neg Hx    Colon polyps Neg Hx    Rectal cancer Neg Hx    Stomach cancer Neg Hx     Past Surgical History:  Procedure Laterality Date   COLONOSCOPY     HERNIA REPAIR     KIDNEY STONE SURGERY     POLYPECTOMY     TONSILLECTOMY     VASECTOMY     Social History   Occupational History   Not on file  Tobacco Use   Smoking status: Never   Smokeless tobacco: Former    Types: Chew  Substance and Sexual Activity   Alcohol use: No    Alcohol/week: 0.0 standard drinks   Drug use: No   Sexual activity: Not on file

## 2021-07-17 ENCOUNTER — Encounter: Payer: Self-pay | Admitting: Orthopaedic Surgery

## 2021-07-17 ENCOUNTER — Other Ambulatory Visit: Payer: Self-pay

## 2021-07-17 ENCOUNTER — Ambulatory Visit (INDEPENDENT_AMBULATORY_CARE_PROVIDER_SITE_OTHER): Payer: No Typology Code available for payment source | Admitting: Orthopaedic Surgery

## 2021-07-17 ENCOUNTER — Ambulatory Visit (INDEPENDENT_AMBULATORY_CARE_PROVIDER_SITE_OTHER): Payer: No Typology Code available for payment source

## 2021-07-17 VITALS — Ht 67.0 in | Wt 163.0 lb

## 2021-07-17 DIAGNOSIS — M25521 Pain in right elbow: Secondary | ICD-10-CM

## 2021-07-17 NOTE — Progress Notes (Signed)
? ?Office Visit Note ?  ?Patient: Andrew Morrow           ?Date of Birth: 1955/12/05           ?MRN: [id number] ?Visit Date: 07/17/2021 ?             ?Requested by: Eulogio Bear, NP ?No address on file ?PCP: Eulogio Bear, NP ? ? ?Assessment & Plan: ?Visit Diagnoses:  ?1. Pain in right elbow   ? ? 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  At this point, the patient is asymptomatic.  He has regained near full range of motion of the elbow.  We will allow him to return to work with restrictions to include no lifting greater than 20 pounds left upper extremity for the next 6 weeks.  He will follow-up with [place] in 6 weeks time for repeat evaluation and three-view x-rays of the right elbow.  Call with concerns or questions in meantime. ? ?Follow-Up Instructions: Return in about 6 weeks (around 08/28/2021).  ? ?Orders:  ?Orders Placed This Encounter  ?Procedures  ? XR Elbow Complete Right (3+View)  ? ?No orders of the defined types were placed in this encounter. ? ? ? ? Procedures: ?No procedures performed ? ? ?Clinical Data: ?No additional findings. ? ? ?Subjective: ?Chief Complaint  ?Patient presents with  ? Right Elbow - Pain  ?  OTJI 05/14/2021  ? ? ?HPI patient is a pleasant 66 year old gentleman who comes in today little over 2 months out right elbow work comp injury, date of surgery 05/14/2021.  Injury occurred while he was coming down a ladder at work and hit the back of his elbow on a hard object.  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  Patient notes he had 7 previous fractures to the elbow.  He has not had any pain but initially had limited range of motion secondary to stiffness from wearing the splint for 3 weeks.  When he was seen in our office on 06/05/2021 he was sent to outpatient physical therapy where he has made great progress.  He denies  any pain to the elbow.  He has been working with restrictions of no lifting greater than 3 pounds.  Doing well with this. ? ? ? ? ?Objective: ?Vital Signs: Ht '5\' 7"'$  (1.702 m)   Wt 163 lb (73.9 kg)   BMI 25.53 kg/m?  ? ? ? ?Ortho Exam x-rays demonstrate no bony tenderness.  He does lack approximately 3 degrees of extension.  He has near full flexion.  He is neurovascularly intact distally. ? ?Specialty Comments:  ?No specialty comments available. ? ?Imaging: ?XR Elbow Complete Right (3+View) ? ?Result Date: 07/17/2021 ?There is consolidation of capitellar fracture fragments.    ? ? ?PMFS History: ?Patient Active Problem List  ? Diagnosis Date Noted  ? Graves' disease 10/03/2020  ? Polyp of colon 10/03/2020  ? Urolithiasis 12/19/2014  ? Diabetes ([place]) 07/27/2014  ? HYPERCHOLESTEROLEMIA 02/13/2010  ? HYPOGONADISM 08/15/2008  ? Thyrotoxicosis 06/29/2007  ? Essential hypertension 06/29/2007  ? ?Past Medical History:  ?Diagnosis Date  ? Adenoma of colon   ? Cataract   ? forming   ? Chronic kidney disease   ? left upper pole stone  ? Diabetes mellitus without complication ([place])   ? diet controlled- pt states uses lants PRN only- no shot in 8  months  ? Encounter for long-term (current) use of other medications 06/01/2012  ? Foot pain, left 03/07/2019  ? Hematuria 07/27/2014  ? HTN (hypertension)   ? Hyperlipidemia   ? Hyperthyroidism   ? Routine general medical examination at a health care facility 12/01/2012  ? Screening for cancer 07/27/2014  ? Screening for prostate cancer 06/01/2012  ? Unspecified disorder of liver 02/13/2010  ? Qualifier: Diagnosis of  By: Loanne Drilling MD, Jacelyn Pi   ?  ?Family History  ?Problem Relation Age of Onset  ? Throat cancer Mother   ?     heavy smoker   ? Esophageal cancer Mother   ? Other Son   ?     Committed Suicide  ? Diabetes Mellitus I Brother   ? Colon cancer Neg Hx   ? Colon polyps Neg Hx   ? Rectal cancer Neg Hx   ? Stomach cancer Neg Hx   ?  ?Past Surgical History:  ?Procedure Laterality Date  ?  COLONOSCOPY    ? HERNIA REPAIR    ? KIDNEY STONE SURGERY    ? POLYPECTOMY    ? TONSILLECTOMY    ? VASECTOMY    ? ?Social History  ? ?Occupational History  ? Not on file  ?Tobacco Use  ? Smoking status: Never  ? Smokeless tobacco: Former  ?  Types: Chew  ?Substance and Sexual Activity  ? Alcohol use: No  ?  Alcohol/week: 0.0 standard drinks  ? Drug use: No  ? Sexual activity: Not on file  ? ? ? ? ? ? ?

## 2021-07-21 ENCOUNTER — Other Ambulatory Visit: Payer: Self-pay | Admitting: Endocrinology

## 2021-08-28 ENCOUNTER — Ambulatory Visit (INDEPENDENT_AMBULATORY_CARE_PROVIDER_SITE_OTHER): Payer: No Typology Code available for payment source | Admitting: Orthopaedic Surgery

## 2021-08-28 ENCOUNTER — Ambulatory Visit (INDEPENDENT_AMBULATORY_CARE_PROVIDER_SITE_OTHER): Payer: No Typology Code available for payment source

## 2021-08-28 ENCOUNTER — Encounter: Payer: Self-pay | Admitting: Orthopaedic Surgery

## 2021-08-28 DIAGNOSIS — M25521 Pain in right elbow: Secondary | ICD-10-CM

## 2021-08-28 NOTE — Progress Notes (Signed)
? ?Office Visit Note ?  ?Patient: Andrew Morrow           ?Date of Birth: [birth year]           ?MRN: [id number] ?Visit Date: 08/28/2021 ?             ?Requested by: Andrew Bear, NP ?No address on file ?PCP: No primary care provider on file. ? ? ?Assessment & Plan: ?Visit Diagnoses:  ?1. Pain in right elbow   ? ? ?Plan: Andrew Morrow follows up today for his right elbow injury.  He is currently working full-time without any restrictions he feels soreness in the elbow.  Not taking any medications. ? ?Examination of the right elbow shows functional range of motion.  Lacks about 15 degrees of full extension. ? ?From my standpoint Andrew Morrow is doing well and the elbow is very functional.  For his 15 degree limitation in extension I give him a rating of 5% impairment.  He may return back to work without any restrictions.  Follow-up as needed. ? ?Follow-Up Instructions: No follow-ups on file.  ? ?Orders:  ?Orders Placed This Encounter  ?Procedures  ? XR Elbow Complete Right (3+View)  ? ?No orders of the defined types were placed in this encounter. ? ? ? ? Procedures: ?No procedures performed ? ? ?Clinical Data: ?No additional findings. ? ? ?Subjective: ?Chief Complaint  ?Patient presents with  ? Right Elbow - Pain  ? ? ?HPI ? ?Review of Systems  ?Constitutional: Negative.   ?All other systems reviewed and are negative. ? ? ?Objective: ?Vital Signs: There were no vitals taken for this visit. ? ?Physical Exam ?Vitals and nursing note reviewed.  ?Constitutional:   ?   Appearance: He is well-developed.  ?Pulmonary:  ?   Effort: Pulmonary effort is normal.  ?Abdominal:  ?   Palpations: Abdomen is soft.  ?Skin: ?   General: Skin is warm.  ?Neurological:  ?   Mental Status: He is alert and oriented to person, place, and time.  ?Psychiatric:     ?   Behavior: Behavior normal.     ?   Thought Content: Thought content normal.     ?   Judgment: Judgment normal.  ? ? ?Ortho Exam ? ?Specialty Comments:  ?No specialty comments  available. ? ?Imaging: ?XR Elbow Complete Right (3+View) ? ?Result Date: 08/28/2021 ?Baseline degenerative changes of elbow joint.  No acute abnormalities.  ? ? ?PMFS History: ?Patient Active Problem List  ? Diagnosis Date Noted  ? Graves' disease 10/03/2020  ? Polyp of colon 10/03/2020  ? Urolithiasis 12/19/2014  ? Diabetes ([place]) 07/27/2014  ? HYPERCHOLESTEROLEMIA 02/13/2010  ? HYPOGONADISM 08/15/2008  ? Thyrotoxicosis 06/29/2007  ? Essential hypertension 06/29/2007  ? ?Past Medical History:  ?Diagnosis Date  ? Adenoma of colon   ? Cataract   ? forming   ? Chronic kidney disease   ? left upper pole stone  ? Diabetes mellitus without complication ([place])   ? diet controlled- pt states uses lants PRN only- no shot in 8 months  ? Encounter for long-term (current) use of other medications 06/01/2012  ? Foot pain, left 03/07/2019  ? Hematuria 07/27/2014  ? HTN (hypertension)   ? Hyperlipidemia   ? Hyperthyroidism   ? Routine general medical examination at a health care facility 12/01/2012  ? Screening for cancer 07/27/2014  ? Screening for prostate cancer 06/01/2012  ? Unspecified disorder of liver 02/13/2010  ? Qualifier: Diagnosis of  By: Loanne Drilling MD, Hilliard Clark A   ?  ?  Family History  ?Problem Relation Age of Onset  ? Throat cancer Mother   ?     heavy smoker   ? Esophageal cancer Mother   ? Other Son   ?     Committed Suicide  ? Diabetes Mellitus I Brother   ? Colon cancer Neg Hx   ? Colon polyps Neg Hx   ? Rectal cancer Neg Hx   ? Stomach cancer Neg Hx   ?  ?Past Surgical History:  ?Procedure Laterality Date  ? COLONOSCOPY    ? HERNIA REPAIR    ? KIDNEY STONE SURGERY    ? POLYPECTOMY    ? TONSILLECTOMY    ? VASECTOMY    ? ?Social History  ? ?Occupational History  ? Not on file  ?Tobacco Use  ? Smoking status: Never  ? Smokeless tobacco: Former  ?  Types: Chew  ?Substance and Sexual Activity  ? Alcohol use: No  ?  Alcohol/week: 0.0 standard drinks  ? Drug use: No  ? Sexual activity: Not on file  ? ? ? ? ? ? ?

## 2021-09-25 ENCOUNTER — Ambulatory Visit: Payer: 59 | Admitting: Endocrinology

## 2021-10-10 ENCOUNTER — Ambulatory Visit: Payer: 59 | Admitting: Nurse Practitioner

## 2021-10-23 ENCOUNTER — Other Ambulatory Visit: Payer: Self-pay | Admitting: Nurse Practitioner

## 2021-10-23 NOTE — Telephone Encounter (Signed)
Requested Prescriptions  Pending Prescriptions Disp Refills  . lisinopril-hydrochlorothiazide (ZESTORETIC) 20-25 MG tablet [Pharmacy Med Name: LISINOPRIL-HCTZ 20-25 MG TAB] 90 tablet 1    Sig: TAKE 1 TABLET BY MOUTH EVERY DAY     Cardiovascular:  ACEI + Diuretic Combos Failed - 10/23/2021 10:07 AM      Failed - Na in normal range and within 180 days    Sodium  Date Value Ref Range Status  04/11/2021 141 135 - 146 mmol/L Final         Failed - K in normal range and within 180 days    Potassium  Date Value Ref Range Status  04/11/2021 4.3 3.5 - 5.3 mmol/L Final         Failed - Cr in normal range and within 180 days    Creat  Date Value Ref Range Status  04/11/2021 1.20 0.70 - 1.35 mg/dL Final   Creatinine,U  Date Value Ref Range Status  03/08/2018 93.6 mg/dL Final         Failed - eGFR is 30 or above and within 180 days    GFR, Est African American  Date Value Ref Range Status  10/18/2020 54 (L) > OR = 60 mL/min/1.2m Final   GFR, Est Non African American  Date Value Ref Range Status  10/18/2020 46 (L) > OR = 60 mL/min/1.778mFinal   GFR  Date Value Ref Range Status  03/07/2019 69.18 >60.00 mL/min Final   eGFR  Date Value Ref Range Status  04/11/2021 68 > OR = 60 mL/min/1.7340minal    Comment:    The eGFR is based on the CKD-EPI 2021 equation. To calculate  the new eGFR from a previous Creatinine or Cystatin C result, go to https://www.kidney.org/professionals/ kdoqi/gfr%5Fcalculator          Failed - Last BP in normal range    BP Readings from Last 1 Encounters:  05/24/21 140/64         Failed - Valid encounter within last 6 months    Recent Outpatient Visits          6 months [facility]   1 year ago Essential hypertension   [facility]             Passed - Patient is not pregnant

## 2021-11-21 ENCOUNTER — Ambulatory Visit: Payer: 59 | Admitting: Endocrinology

## 2021-12-16 ENCOUNTER — Other Ambulatory Visit: Payer: Self-pay | Admitting: Endocrinology

## 2021-12-16 DIAGNOSIS — E0580 Other thyrotoxicosis without thyrotoxic crisis or storm: Secondary | ICD-10-CM

## 2021-12-16 DIAGNOSIS — E119 Type 2 diabetes mellitus without complications: Secondary | ICD-10-CM

## 2021-12-18 ENCOUNTER — Other Ambulatory Visit (INDEPENDENT_AMBULATORY_CARE_PROVIDER_SITE_OTHER): Payer: 59

## 2021-12-18 DIAGNOSIS — E0580 Other thyrotoxicosis without thyrotoxic crisis or storm: Secondary | ICD-10-CM

## 2021-12-18 DIAGNOSIS — E119 Type 2 diabetes mellitus without complications: Secondary | ICD-10-CM | POA: Diagnosis not present

## 2021-12-18 LAB — COMPREHENSIVE METABOLIC PANEL
ALT: 15 U/L (ref 0–53)
AST: 19 U/L (ref 0–37)
Albumin: 4.3 g/dL (ref 3.5–5.2)
Alkaline Phosphatase: 84 U/L (ref 39–117)
BUN: 17 mg/dL (ref 6–23)
CO2: 28 mEq/L (ref 19–32)
Calcium: 9.4 mg/dL (ref 8.4–10.5)
Chloride: 95 mEq/L — ABNORMAL LOW (ref 96–112)
Creatinine, Ser: 1.21 mg/dL (ref 0.40–1.50)
GFR: 62.96 mL/min (ref >60.00)
Glucose, Bld: 102 mg/dL — ABNORMAL HIGH (ref 70–99)
Potassium: 4.1 mEq/L (ref 3.5–5.1)
Sodium: 133 mEq/L — ABNORMAL LOW (ref 135–145)
Total Bilirubin: 0.7 mg/dL (ref 0.2–1.2)
Total Protein: 7 g/dL (ref 6.0–8.3)

## 2021-12-18 LAB — TSH: TSH: 2.75 u[IU]/mL (ref 0.35–5.50)

## 2021-12-18 LAB — T4, FREE: Free T4: 0.96 ng/dL (ref 0.60–1.60)

## 2021-12-18 LAB — HEMOGLOBIN A1C: Hgb A1c MFr Bld: 6.5 % (ref 4.6–6.5)

## 2021-12-19 LAB — THYROTROPIN RECEPTOR AUTOABS: Thyrotropin Receptor Ab: <1.1 IU/L (ref 0.00–1.75)

## 2021-12-20 ENCOUNTER — Encounter: Payer: Self-pay | Admitting: Endocrinology

## 2021-12-20 ENCOUNTER — Ambulatory Visit (INDEPENDENT_AMBULATORY_CARE_PROVIDER_SITE_OTHER): Payer: 59 | Admitting: Endocrinology

## 2021-12-20 VITALS — BP 132/72 | HR 73 | Ht 66.5 in | Wt 169.8 lb

## 2021-12-20 DIAGNOSIS — N529 Male erectile dysfunction, unspecified: Secondary | ICD-10-CM | POA: Diagnosis not present

## 2021-12-20 DIAGNOSIS — E0590 Thyrotoxicosis, unspecified without thyrotoxic crisis or storm: Secondary | ICD-10-CM

## 2021-12-20 DIAGNOSIS — I10 Essential (primary) hypertension: Secondary | ICD-10-CM

## 2021-12-20 DIAGNOSIS — E119 Type 2 diabetes mellitus without complications: Secondary | ICD-10-CM | POA: Diagnosis not present

## 2021-12-20 MED ORDER — SILDENAFIL CITRATE 100 MG PO TABS
100.0000 mg | ORAL_TABLET | Freq: Every day | ORAL | 3 refills | Status: DC | PRN
Start: 2021-12-20 — End: 2022-04-27

## 2021-12-20 MED ORDER — ONETOUCH ULTRA VI STRP: ORAL_STRIP | 1 refills | Status: DC

## 2021-12-20 NOTE — Progress Notes (Unsigned)
Patient ID: Andrew Morrow, male   DOB: 1955-12-18, 66 y.o.   MRN: [id number]           Reason for Appointment: Type II Diabetes follow-up   History of Present Illness   Diagnosis date: 2011  Previous history:  Non-insulin hypoglycemic drugs previously used: Metformin, Actos Insulin was used for a few years between 2011 and 2014 but appeared to be taking only very small doses totaling 5 units of Lantus and 4 units of Humalog at meals At that time his weight was about the same  A1c range in the last few years is:  Recent history:     Non-insulin hypoglycemic drugs: Metformin ER 1500 mg, Actos     Side effects from medications: None  Current self management, blood sugar patterns and problems identified:  A1c is 6.5 compared to 6.1  Exercise: walks  Diet management:     Hypoglycemia:  none    Glucometer: One Touch.           Blood Glucose readings recall range 82-146, mostly done fasting  Dietician visit: Most recent:      Weight control:  Wt Readings from Last 3 Encounters:  12/20/21 169 lb 12.8 oz (77 kg)  07/17/21 163 lb (73.9 kg)  05/24/21 172 lb 6.4 oz (78.2 kg)            Diabetes labs:  Lab Results  Component Value Date   HGBA1C 6.5 12/18/2021   HGBA1C 6.1 (A) 05/24/2021   HGBA1C 5.9 (A) 11/20/2020   Lab Results  Component Value Date   MICROALBUR <0.2 04/11/2021   LDLCALC 85 04/11/2021   CREATININE 1.21 12/18/2021    No results found for: "FRUCTOSAMINE"   Allergies as of 12/20/2021   No Known Allergies      Medication List        Accurate as of December 20, 2021 11:59 PM. If you have any questions, ask your nurse or doctor.          acetaminophen 500 MG tablet Commonly known as: TYLENOL Take 500 mg by mouth every 6 (six) hours as needed for moderate pain.   atorvastatin 20 MG tablet Commonly known as: LIPITOR Take 1 tablet (20 mg total) by mouth daily.   atorvastatin 40 MG tablet Commonly known as: LIPITOR Take 1 tablet (40  mg total) by mouth daily.   lisinopril-hydrochlorothiazide 20-25 MG tablet Commonly known as: ZESTORETIC Take 1 tablet by mouth daily.   metFORMIN 500 MG 24 hr tablet Commonly known as: GLUCOPHAGE-XR Take 3 tablets (1,500 mg total) by mouth daily.   methimazole 10 MG tablet Commonly known as: TAPAZOLE TAKE 1 TABLET BY MOUTH EVERY DAY   multivitamin tablet Take 1 tablet by mouth every morning.   OneTouch Ultra test strip Generic drug: glucose blood USE AS DIRECTED What changed:  when to take this additional instructions Changed by: Elayne Snare, MD   pioglitazone 15 MG tablet Commonly known as: ACTOS TAKE 1 TABLET BY MOUTH EVERY DAY   sildenafil 100 MG tablet Commonly known as: VIAGRA Take 1 tablet (100 mg total) by mouth daily as needed.        Allergies: No Known Allergies  Past Medical History:  Diagnosis Date   Adenoma of colon    Cataract    forming    Chronic kidney disease    left upper pole stone   Diabetes mellitus without complication ([facility])    diet controlled- pt states uses lants PRN only- no shot in  8 months   Encounter for long-term (current) use of other medications 06/01/2012   Foot pain, left 03/07/2019   Hematuria 07/27/2014   HTN (hypertension)    Hyperlipidemia    Hyperthyroidism    Routine general medical examination at a health care facility 12/01/2012   Screening for cancer 07/27/2014   Screening for prostate cancer 06/01/2012   Unspecified disorder of liver 02/13/2010   Qualifier: Diagnosis of  By: Loanne Drilling MD, Jacelyn Pi     Past Surgical History:  Procedure Laterality Date   COLONOSCOPY     HERNIA REPAIR     KIDNEY STONE SURGERY     POLYPECTOMY     TONSILLECTOMY     VASECTOMY      Family History  Problem Relation Age of Onset   Throat cancer Mother        heavy smoker    Esophageal cancer Mother    Other Son        Committed Suicide   Diabetes Mellitus I Brother    Colon cancer Neg Hx    Colon polyps Neg Hx    Rectal cancer  Neg Hx    Stomach cancer Neg Hx     Social History:  reports that he has never smoked. He has quit using smokeless tobacco.  His smokeless tobacco use included chew. He reports that he does not drink alcohol and does not use drugs.  Review of Systems:  HYPERTHYROIDISM:  About initially about the year 2000  he apparently had routine labs indicating hyperthyroidism but does not remember having any symptoms He was told by his treating physician at that time to take treatment with I-131 Oldest lab results available show TSH of 0.03 and free T4 was 1.4 as of 12/2005 Also he does not think he had radioactive iodine uptake or scan done  Apparently with his most recent endocrinologist the has been treated long-term with methimazole 10 mg daily although was off treatment between 2005 and 2007 He did not feel any different with starting methimazole Thyroid levels have been consistently normal on treatment  Lab Results  Component Value Date   TSH 2.75 12/18/2021   TSH 3.22 05/24/2021   TSH 2.10 11/20/2020   FREET4 0.96 12/18/2021   FREET4 0.79 05/24/2021   FREET4 0.69 11/20/2020    Last diabetic eye exam date 04/2021  Last urine microalbumin date: 12/22  Last foot exam date: 7/22  Symptoms of neuropathy: None  Hypertension:   ACE/ARB medication: Lisinopril  BP Readings from Last 3 Encounters:  12/20/21 132/72  05/24/21 140/64  04/11/21 140/68    Lipid management:    Lab Results  Component Value Date   CHOL 159 04/11/2021   CHOL 199 10/04/2020   CHOL 157 03/08/2018   Lab Results  Component Value Date   HDL 48 04/11/2021   HDL 47 10/04/2020   HDL 42.10 03/08/2018   Lab Results  Component Value Date   LDLCALC 85 04/11/2021   LDLCALC 118 (H) 10/04/2020   LDLCALC 77 03/08/2018   Lab Results  Component Value Date   TRIG 164 (H) 04/11/2021   TRIG 220 (H) 10/04/2020   TRIG 189.0 (H) 03/08/2018   Lab Results  Component Value Date   CHOLHDL 3.3 04/11/2021    CHOLHDL 4.2 10/04/2020   CHOLHDL 4 03/08/2018   Lab Results  Component Value Date   LDLDIRECT 110.9 06/02/2012     Examination:   BP 132/72   Pulse 73   Ht 5' 6.5" (1.689  m)   Wt 169 lb 12.8 oz (77 kg)   SpO2 98%   BMI 27.00 kg/m   Body mass index is 27 kg/m.   No tremor Thyroid not palpable Biceps reflexes show mild brisk relaxation No peripheral edema  ASSESSMENT/ PLAN:    Diabetes type 2 nonobese  Current regimen: Actos 15 mg daily, metformin ER 1500 mg daily  See history of present illness for detailed discussion of current diabetes management, blood sugar patterns and problems identified  A1c is excellent at 6.5  Recommendations:  Start checking blood sugars by rotation either 2 hours after meals or periodically fasting  History of hyperthyroidism: Unclear if he only had subclinical hyperthyroidism or true hyperthyroidism at onset and likely had mild Graves' disease  Appears that he is in remission Since thyrotropin receptor antibody is undetectable and he has no goiter he likely will not need to be on antithyroid drugs  He will stop methimazole and follow-up in 3 months Discussed what symptoms to look for if he has a recurrence of hyperthyroidism  Patient Instructions  Stop Methimazole  Check blood sugars on waking up 2-3 days a week  Also check blood sugars about 2 hours after meals and do this after different meals by rotation  Recommended blood sugar levels on waking up are 90-120 and about 2 hours after meal is 130-160  Please bring your blood sugar monitor to each visit, thank you  Medical records relevant to diabetes, hyperthyroidism lipids and hypertension were reviewed in detail and incomplete documentation was completed as above. Total visit time for evaluation and management, review of all records as above and counseling for multiple problems = 40 minutes  Elayne Snare 12/21/2021, 4:26 PM

## 2021-12-20 NOTE — Patient Instructions (Addendum)
Stop Methimazole  Check blood sugars on waking up 2-3 days a week  Also check blood sugars about 2 hours after meals and do this after different meals by rotation  Recommended blood sugar levels on waking up are 90-120 and about 2 hours after meal is 130-160  Please bring your blood sugar monitor to each visit, thank you

## 2021-12-22 DIAGNOSIS — N529 Male erectile dysfunction, unspecified: Secondary | ICD-10-CM | POA: Insufficient documentation

## 2022-01-16 ENCOUNTER — Telehealth: Payer: Self-pay

## 2022-01-16 NOTE — Telephone Encounter (Signed)
Left message to return call. Patient on list to reestablish care with our NP.

## 2022-01-21 ENCOUNTER — Encounter: Payer: Self-pay | Admitting: Family Medicine

## 2022-01-21 ENCOUNTER — Other Ambulatory Visit: Payer: Self-pay

## 2022-01-21 ENCOUNTER — Ambulatory Visit: Payer: 59 | Admitting: Family Medicine

## 2022-01-21 VITALS — BP 130/68 | HR 63 | Temp 97.8°F | Ht 66.0 in | Wt 174.0 lb

## 2022-01-21 DIAGNOSIS — Z0000 Encounter for general adult medical examination without abnormal findings: Secondary | ICD-10-CM

## 2022-01-21 DIAGNOSIS — E7800 Pure hypercholesterolemia, unspecified: Secondary | ICD-10-CM

## 2022-01-21 DIAGNOSIS — I10 Essential (primary) hypertension: Secondary | ICD-10-CM

## 2022-01-21 DIAGNOSIS — Z23 Encounter for immunization: Secondary | ICD-10-CM

## 2022-01-21 DIAGNOSIS — N529 Male erectile dysfunction, unspecified: Secondary | ICD-10-CM | POA: Diagnosis not present

## 2022-01-21 DIAGNOSIS — E1136 Type 2 diabetes mellitus with diabetic cataract: Secondary | ICD-10-CM | POA: Diagnosis not present

## 2022-01-21 DIAGNOSIS — E0590 Thyrotoxicosis, unspecified without thyrotoxic crisis or storm: Secondary | ICD-10-CM

## 2022-01-21 DIAGNOSIS — Z125 Encounter for screening for malignant neoplasm of prostate: Secondary | ICD-10-CM

## 2022-01-21 DIAGNOSIS — E0580 Other thyrotoxicosis without thyrotoxic crisis or storm: Secondary | ICD-10-CM

## 2022-01-21 NOTE — Addendum Note (Signed)
Addended by: Colman Cater on: 01/21/2022 03:44 PM   Modules accepted: Orders

## 2022-01-21 NOTE — Progress Notes (Addendum)
Complete physical exam  Patient: Andrew Morrow   DOB: 1955-10-07   66 y.o. Male  MRN: [id number]  Subjective:    Chief Complaint  Patient presents with   Establish Care    Andrew Morrow is a 66 y.o. male who presents today for a complete physical exam and to re-establish care. Discussed practice expectations and introduced him to the nurse practitioner role.  He reports consuming a general diet. The patient has a physically strenuous job, but has no regular exercise apart from work.  He generally feels well. He reports sleeping well. 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 He does complain that his Sildenafil is ineffective, states he only takes a quarter of a tablet at a time due to only getting 10 tablets per refill.  Chronic kidney disease: 12/18/21 GFR 62.96, creatinine 1.21 Diabetes mellitus: 12/18/21 A1c 6.5, taking metformin and actos Hypertension: taking zestoretic daily Hyperlipidemia: taking lipitor '20mg'$  daily Hyperthyroidism: followed by Endo, labs scheduled 03/24/22 STD: declines Prostate: needs Colonoscopy: needs Tobacco: non-smoker Alcohol: non-drinker Depression/Anxiety: denies Foot exam: done  Past Medical History:  Diagnosis Date   Adenoma of colon    Cataract    forming    Chronic kidney disease    left upper pole stone   Diabetes mellitus without complication ([place])    diet controlled- pt states uses lants PRN only- no shot in 8 months   Encounter for long-term (current) use of other medications 06/01/2012   Foot pain, left 03/07/2019   Hematuria 07/27/2014   HTN (hypertension)    Hyperlipidemia    Hyperthyroidism    Routine general medical examination at a health care facility 12/01/2012   Screening for cancer 07/27/2014   Screening for prostate cancer 06/01/2012   Unspecified disorder of liver 02/13/2010   Qualifier: Diagnosis of  By: Loanne Drilling MD, Jacelyn Pi    Past Surgical History:  Procedure Laterality Date   COLONOSCOPY     HERNIA REPAIR      KIDNEY STONE SURGERY     POLYPECTOMY     TONSILLECTOMY     VASECTOMY     Current Outpatient Medications on File Prior to Visit  Medication Sig Dispense Refill   acetaminophen (TYLENOL) 500 MG tablet Take 500 mg by mouth every 6 (six) hours as needed for moderate pain.     atorvastatin (LIPITOR) 20 MG tablet Take 1 tablet (20 mg total) by mouth daily. 90 tablet 3   glucose blood (ONETOUCH ULTRA) test strip USE AS DIRECTED 100 strip 1   lisinopril-hydrochlorothiazide (ZESTORETIC) 20-25 MG tablet Take 1 tablet by mouth daily. 90 tablet 1   metFORMIN (GLUCOPHAGE-XR) 500 MG 24 hr tablet Take 3 tablets (1,500 mg total) by mouth daily. 270 tablet 3   Multiple Vitamin (MULTIVITAMIN) tablet Take 1 tablet by mouth every morning.      pioglitazone (ACTOS) 15 MG tablet TAKE 1 TABLET BY MOUTH EVERY DAY 90 tablet 3   sildenafil (VIAGRA) 100 MG tablet Take 1 tablet (100 mg total) by mouth daily as needed. 10 tablet 3   No current facility-administered medications on file prior to visit.   No Known Allergies   Most recent fall risk assessment:    01/21/2022    2:47 PM  Fall Risk   Falls in the past year? 0  Number falls in past yr: 0  Injury with Fall? 0     Most recent depression screenings:     No data to display  PSA: Prostate cancer screening and PSA options (with potential risks and benefits of testing vs. not testing) were discussed along with recent recs/guidelines.     Patient Care Team: Patient, No Pcp Per as PCP - General (General Practice)   Outpatient Medications Prior to Visit  Medication Sig   acetaminophen (TYLENOL) 500 MG tablet Take 500 mg by mouth every 6 (six) hours as needed for moderate pain.   atorvastatin (LIPITOR) 20 MG tablet Take 1 tablet (20 mg total) by mouth daily.   glucose blood (ONETOUCH ULTRA) test strip USE AS DIRECTED   lisinopril-hydrochlorothiazide (ZESTORETIC) 20-25 MG tablet Take 1 tablet by mouth daily.   metFORMIN (GLUCOPHAGE-XR) 500  MG 24 hr tablet Take 3 tablets (1,500 mg total) by mouth daily.   Multiple Vitamin (MULTIVITAMIN) tablet Take 1 tablet by mouth every morning.    pioglitazone (ACTOS) 15 MG tablet TAKE 1 TABLET BY MOUTH EVERY DAY   sildenafil (VIAGRA) 100 MG tablet Take 1 tablet (100 mg total) by mouth daily as needed.   No facility-administered medications prior to visit.    Review of Systems  Constitutional: Negative.   HENT:  Positive for hearing loss.   Eyes: Negative.   Respiratory: Negative.    Cardiovascular: Negative.   Gastrointestinal: Negative.   Genitourinary: Negative.        Erectile dysfunction  Musculoskeletal: Negative.   Skin: Negative.   Neurological: Negative.   Endo/Heme/Allergies: Negative.   Psychiatric/Behavioral: Negative.    All other systems reviewed and are negative.         Objective:     Ht '5\' 6"'$  (1.676 m)   Wt 174 lb (78.9 kg)   BMI 28.08 kg/m    Physical Exam Constitutional:      Appearance: Normal appearance. He is normal weight.  HENT:     Head: Normocephalic and atraumatic.     Right Ear: Tympanic membrane normal.     Left Ear: Tympanic membrane normal.     Nose: Nose normal.     Mouth/Throat:     Mouth: Mucous membranes are moist.     Pharynx: Oropharynx is clear.  Eyes:     Extraocular Movements: Extraocular movements intact.     Conjunctiva/sclera: Conjunctivae normal.     Pupils: Pupils are equal, round, and reactive to light.  Neck:     Comments: Enlarged thyroid Cardiovascular:     Rate and Rhythm: Normal rate and regular rhythm.     Pulses: Normal pulses.     Heart sounds: Normal heart sounds.  Pulmonary:     Effort: Pulmonary effort is normal.     Breath sounds: Normal breath sounds.  Abdominal:     General: Abdomen is flat.     Palpations: Abdomen is soft.  Musculoskeletal:        General: Normal range of motion.     Cervical back: Normal range of motion.  Skin:    General: Skin is warm and dry.     Capillary Refill:  Capillary refill takes less than 2 seconds.  Neurological:     General: No focal deficit present.     Mental Status: He is alert and oriented to person, place, and time.  Psychiatric:        Mood and Affect: Mood normal.        Behavior: Behavior normal.        Thought Content: Thought content normal.        Judgment: Judgment normal.      No  results found for any visits on 01/21/22.     Assessment & Plan:    Routine Health Maintenance and Physical Exam  Immunization History  Administered Date(s) Administered   Influenza,inj,Quad PF,6+ Mos 03/08/2013, 01/26/2014, 03/29/2015, 03/26/2016, 03/08/2018, 03/07/2019, 03/08/2020, 04/12/2021   Influenza-Unspecified 03/09/2017, 02/10/2019   PFIZER(Purple Top)SARS-COV-2 Vaccination 07/23/2019, 08/15/2019, 03/21/2020   Pneumococcal Polysaccharide-23 12/01/2012   Td 08/11/2006   Tdap 08/19/2016   Zoster Recombinat (Shingrix) 10/11/2019, 12/28/2019    Health Maintenance  Topic Date Due   COVID-19 Vaccine (4 - Pfizer series) 05/16/2020   COLONOSCOPY (Pts 45-41yr Insurance coverage will need to be confirmed)  04/13/2021   Pneumonia Vaccine 65 Years old (2 - PCV) 09/24/2021   FOOT EXAM  11/20/2021   INFLUENZA VACCINE  12/10/2021   OPHTHALMOLOGY EXAM  04/30/2022   HEMOGLOBIN A1C  06/20/2022   TETANUS/TDAP  08/20/2026   Hepatitis C Screening  Completed   HIV Screening  Completed   Zoster Vaccines- Shingrix  Completed   HPV VACCINES  Aged Out    Discussed health benefits of physical activity, and encouraged him to engage in regular exercise appropriate for his age and condition.  Routine adult health maintenance - Plan: CBC with Differential/Platelet, PSA  HYPERCHOLESTEROLEMIA - Plan: Lipid panel Continue taking Lipitor '20mg'$  daily, check lipids today  ED (erectile dysfunction) of organic origin Currenly taking quarter of '100mg'$  tablet PRN, this is innefective so I encouraged him to take half a tablet to a whole tablet as  prescribed.  Type 2 diabetes mellitus with diabetic cataract, without long-term current use of insulin ([place]. A1c 6.5 12/17/21  Essential hypertension  Screening for prostate cancer PSA today  Hyperthyroidism Followed by endocrinology, recent labs 12/18/21 TSH 2.75   Follow up in 1 year     ARubie Maid FNP

## 2022-01-22 ENCOUNTER — Other Ambulatory Visit (INDEPENDENT_AMBULATORY_CARE_PROVIDER_SITE_OTHER): Payer: 59

## 2022-01-22 ENCOUNTER — Other Ambulatory Visit: Payer: Self-pay

## 2022-01-22 ENCOUNTER — Other Ambulatory Visit: Payer: Self-pay | Admitting: Family Medicine

## 2022-01-22 DIAGNOSIS — Z23 Encounter for immunization: Secondary | ICD-10-CM | POA: Diagnosis not present

## 2022-01-22 LAB — CBC WITH DIFFERENTIAL/PLATELET
Absolute Monocytes: 591 cells/uL (ref 200–950)
Basophils Absolute: 37 cells/uL (ref 0–200)
Basophils Relative: 0.5 %
Eosinophils Absolute: 161 cells/uL (ref 15–500)
Eosinophils Relative: 2.2 %
HCT: 41.2 % (ref 38.5–50.0)
Hemoglobin: 14 g/dL (ref 13.2–17.1)
Lymphs Abs: 2132 cells/uL (ref 850–3900)
MCH: 30.7 pg (ref 27.0–33.0)
MCHC: 34 g/dL (ref 32.0–36.0)
MCV: 90.4 fL (ref 80.0–100.0)
MPV: 10.1 fL (ref 7.5–12.5)
Monocytes Relative: 8.1 %
Neutro Abs: 4380 cells/uL (ref 1500–7800)
Neutrophils Relative %: 60 %
Platelets: 180 10*3/uL (ref 140–400)
RBC: 4.56 10*6/uL (ref 4.20–5.80)
RDW: 12.4 % (ref 11.0–15.0)
Total Lymphocyte: 29.2 %
WBC: 7.3 10*3/uL (ref 3.8–10.8)

## 2022-01-22 LAB — LIPID PANEL
Cholesterol: 152 mg/dL (ref <200)
HDL: 41 mg/dL (ref >=40)
LDL Cholesterol (Calc): 82 mg/dL (calc)
Non-HDL Cholesterol (Calc): 111 mg/dL (calc) (ref <130)
Total CHOL/HDL Ratio: 3.7 (calc) (ref <5.0)
Triglycerides: 197 mg/dL — ABNORMAL HIGH (ref <150)

## 2022-01-22 LAB — PSA: PSA: 0.89 ng/mL (ref <=4.00)

## 2022-01-22 MED ORDER — LISINOPRIL-HYDROCHLOROTHIAZIDE 20-25 MG PO TABS: 1.0000 | ORAL_TABLET | Freq: Every day | ORAL | 1 refills | Status: DC

## 2022-01-22 NOTE — Telephone Encounter (Signed)
Received efax from pharmacy to request refill of  lisinopril-hydrochlorothiazide (ZESTORETIC) 20-25 MG tablet   Pharmacy:  [id number] [place] [id number] Phone:  [phone] Fax:  [phone] DEA #:  [id number] LOV: 04/11/2021 Patient has an appointment today at 3pm.  Please advise pharmacist.

## 2022-04-10 ENCOUNTER — Other Ambulatory Visit: Payer: 59

## 2022-04-15 ENCOUNTER — Ambulatory Visit: Payer: 59 | Admitting: Endocrinology

## 2022-04-27 ENCOUNTER — Other Ambulatory Visit: Payer: Self-pay | Admitting: Endocrinology

## 2022-05-13 LAB — HM DIABETES EYE EXAM

## 2022-05-19 ENCOUNTER — Other Ambulatory Visit (INDEPENDENT_AMBULATORY_CARE_PROVIDER_SITE_OTHER): Payer: 59

## 2022-05-19 DIAGNOSIS — E0590 Thyrotoxicosis, unspecified without thyrotoxic crisis or storm: Secondary | ICD-10-CM

## 2022-05-19 DIAGNOSIS — E119 Type 2 diabetes mellitus without complications: Secondary | ICD-10-CM

## 2022-05-19 LAB — T3, FREE: T3, Free: 3.6 pg/mL (ref 2.3–4.2)

## 2022-05-19 LAB — HEMOGLOBIN A1C: Hgb A1c MFr Bld: 6.4 % (ref 4.6–6.5)

## 2022-05-19 LAB — T4, FREE: Free T4: 0.76 ng/dL (ref 0.60–1.60)

## 2022-05-19 LAB — BASIC METABOLIC PANEL
BUN: 19 mg/dL (ref 6–23)
CO2: 27 mEq/L (ref 19–32)
Calcium: 9.2 mg/dL (ref 8.4–10.5)
Chloride: 99 mEq/L (ref 96–112)
Creatinine, Ser: 1.14 mg/dL (ref 0.40–1.50)
GFR: 67.42 mL/min (ref >60.00)
Glucose, Bld: 93 mg/dL (ref 70–99)
Potassium: 4.2 mEq/L (ref 3.5–5.1)
Sodium: 135 mEq/L (ref 135–145)

## 2022-05-19 LAB — TSH: TSH: 2.93 u[IU]/mL (ref 0.35–5.50)

## 2022-05-19 LAB — LDL CHOLESTEROL, DIRECT: Direct LDL: 79 mg/dL

## 2022-05-23 ENCOUNTER — Encounter: Payer: Self-pay | Admitting: Endocrinology

## 2022-05-23 ENCOUNTER — Ambulatory Visit: Payer: 59 | Admitting: Endocrinology

## 2022-05-23 VITALS — BP 130/70 | HR 63 | Ht 66.0 in | Wt 170.4 lb

## 2022-05-23 DIAGNOSIS — I10 Essential (primary) hypertension: Secondary | ICD-10-CM | POA: Diagnosis not present

## 2022-05-23 DIAGNOSIS — E119 Type 2 diabetes mellitus without complications: Secondary | ICD-10-CM | POA: Diagnosis not present

## 2022-05-23 DIAGNOSIS — Z8639 Personal history of other endocrine, nutritional and metabolic disease: Secondary | ICD-10-CM

## 2022-05-23 NOTE — Patient Instructions (Signed)
Check blood sugars on waking up days a week  Also check blood sugars about 2 hours after meals and do this after different meals by rotation  Recommended blood sugar levels on waking up are 90-130 and about 2 hours after meal is 130-160  Please bring your blood sugar monitor to each visit, thank you   

## 2022-05-23 NOTE — Progress Notes (Signed)
Patient ID: Andrew Morrow, male   DOB: April 21, 1956, 67 y.o.   MRN: [id number]           Reason for Appointment: Type II Diabetes follow-up   History of Present Illness   Diagnosis date: 2011  Previous history:  Non-insulin hypoglycemic drugs previously used: Metformin, Actos Actos was started in 11/21 Insulin was used for a few years between 2011 and 2014 but appeared to be taking only very small doses totaling 5 units of Lantus and 4 units of Humalog at meals At that time his weight was about the same  A1c range in the last few years is: 5.6-7.4  Recent history:     Non-insulin hypoglycemic drugs: Metformin ER 1500 mg, Actos 15 mg daily     Side effects from medications: None  Current self management, blood sugar patterns and problems identified:  A1c is at the same at 6.4 He brought in his manual blood sugar diary but mostly shows again a before breakfast and dinner He is still using an old ultra 2 monitor Generally trying to follow a good diet Lab glucose was 93 late morning Currently no side effects from Actos or metformin which he takes regularly Usually has had a stable weight with BMI 27  Exercise: Mostly walking at work regularly     Hypoglycemia:  none    Glucometer: One Touch ultra 2.           Blood Glucose readings from home diary   PRE-MEAL Fasting Lunch Dinner Bedtime Overall  Glucose range: 101-115  84-93    Mean/median:     ?   POST-MEAL PC Breakfast PC Lunch PC Dinner  Glucose range: 124, 125  180  Mean/median:        Dietician visit: Most recent: None     Weight control:  Wt Readings from Last 3 Encounters:  05/23/22 170 lb 6.4 oz (77.3 kg)  01/21/22 174 lb (78.9 kg)  12/20/21 169 lb 12.8 oz (77 kg)            Diabetes labs:  Lab Results  Component Value Date   HGBA1C 6.4 05/19/2022   HGBA1C 6.5 12/18/2021   HGBA1C 6.1 (A) 05/24/2021   Lab Results  Component Value Date   MICROALBUR <0.2 04/11/2021   LDLCALC 82 01/21/2022    CREATININE 1.14 05/19/2022    No results found for: "FRUCTOSAMINE"   Allergies as of 05/23/2022   No Known Allergies      Medication List        Accurate as of May 23, 2022  2:54 PM. If you have any questions, ask your nurse or doctor.          acetaminophen 500 MG tablet Commonly known as: TYLENOL Take 500 mg by mouth every 6 (six) hours as needed for moderate pain.   atorvastatin 20 MG tablet Commonly known as: LIPITOR Take 1 tablet (20 mg total) by mouth daily.   lisinopril-hydrochlorothiazide 20-25 MG tablet Commonly known as: ZESTORETIC Take 1 tablet by mouth daily.   metFORMIN 500 MG 24 hr tablet Commonly known as: GLUCOPHAGE-XR Take 3 tablets (1,500 mg total) by mouth daily.   multivitamin tablet Take 1 tablet by mouth every morning.   OneTouch Ultra test strip Generic drug: glucose blood USE AS DIRECTED   pioglitazone 15 MG tablet Commonly known as: ACTOS TAKE 1 TABLET BY MOUTH EVERY DAY   sildenafil 100 MG tablet Commonly known as: VIAGRA TAKE 1 TABLET (100 MG TOTAL) BY MOUTH DAILY  AS NEEDED.        Allergies: No Known Allergies  Past Medical History:  Diagnosis Date   Adenoma of colon    Cataract    forming    Chronic kidney disease    left upper pole stone   Diabetes mellitus without complication ([place])    diet controlled- pt states uses lants PRN only- no shot in 8 months   Encounter for long-term (current) use of other medications 06/01/2012   Foot pain, left 03/07/2019   Hematuria 07/27/2014   HTN (hypertension)    Hyperlipidemia    Hyperthyroidism    Routine general medical examination at a health care facility 12/01/2012   Screening for cancer 07/27/2014   Screening for prostate cancer 06/01/2012   Unspecified disorder of liver 02/13/2010   Qualifier: Diagnosis of  By: Loanne Drilling MD, Jacelyn Pi     Past Surgical History:  Procedure Laterality Date   COLONOSCOPY     HERNIA REPAIR     KIDNEY STONE SURGERY     POLYPECTOMY      TONSILLECTOMY     VASECTOMY      Family History  Problem Relation Age of Onset   Throat cancer Mother        heavy smoker    Esophageal cancer Mother    Other Son        Committed Suicide   Diabetes Mellitus I Brother    Colon cancer Neg Hx    Colon polyps Neg Hx    Rectal cancer Neg Hx    Stomach cancer Neg Hx     Social History:  reports that he has never smoked. He has quit using smokeless tobacco.  His smokeless tobacco use included chew. He reports that he does not drink alcohol and does not use drugs.  Review of Systems:  HYPERTHYROIDISM:  About initially about the year 2000  he apparently had routine labs indicating hyperthyroidism but does not remember having any symptoms He was told by his treating physician at that time to take treatment with I-131 Oldest lab results available show TSH of 0.03 and free T4 was 1.4 as of 12/2005 Also he does not think he had radioactive iodine uptake or scan done  Apparently with his most recent endocrinologist the has been treated long-term with methimazole 10 mg daily although was off treatment between 2005 and 2007 He did not feel any different with starting methimazole  With his thyroid functions being normal as well as absent thyrotropin antibody in 8/23 he has been taken off methimazole  Thyroid levels have stayed normal now  Lab Results  Component Value Date   TSH 2.93 05/19/2022   TSH 2.75 12/18/2021   TSH 3.22 05/24/2021   FREET4 0.76 05/19/2022   FREET4 0.96 12/18/2021   FREET4 0.79 05/24/2021   Lab Results  Component Value Date   THYROTRECAB <1.10 12/18/2021     Last diabetic eye exam date 04/2021  Last urine microalbumin date: 12/22  Last foot exam date: 1/24  Symptoms of neuropathy: None Other complications: Erectile dysfunction treated with sildenafil  Hypertension:   ACE/ARB medication: Lisinopril 20 mg  BP Readings from Last 3 Encounters:  05/23/22 130/70  01/21/22 130/68  12/20/21 132/72     Lipid management: Taking Lipitor 20 mg daily prescribed by PCP    Lab Results  Component Value Date   CHOL 152 01/21/2022   CHOL 159 04/11/2021   CHOL 199 10/04/2020   Lab Results  Component Value Date  HDL 41 01/21/2022   HDL 48 04/11/2021   HDL 47 10/04/2020   Lab Results  Component Value Date   LDLCALC 82 01/21/2022   LDLCALC 85 04/11/2021   LDLCALC 118 (H) 10/04/2020   Lab Results  Component Value Date   TRIG 197 (H) 01/21/2022   TRIG 164 (H) 04/11/2021   TRIG 220 (H) 10/04/2020   Lab Results  Component Value Date   CHOLHDL 3.7 01/21/2022   CHOLHDL 3.3 04/11/2021   CHOLHDL 4.2 10/04/2020   Lab Results  Component Value Date   LDLDIRECT 79.0 05/19/2022   LDLDIRECT 110.9 06/02/2012     Examination:   BP 130/70   Pulse 63   Ht '5\' 6"'$  (1.676 m)   Wt 170 lb 6.4 oz (77.3 kg)   SpO2 98%   BMI 27.50 kg/m   Body mass index is 27.5 kg/m.   Diabetic Foot Exam - Simple   Simple Foot Form Diabetic Foot exam was performed with the following findings: Yes   Visual Inspection No deformities, no ulcerations, no other skin breakdown bilaterally: Yes Sensation Testing Intact to touch and monofilament testing bilaterally: Yes Pulse Check Posterior Tibialis and Dorsalis pulse intact bilaterally: Yes Comments     ASSESSMENT/ PLAN: 1/24   Diabetes type 2 nonobese  Current regimen: Actos 15 mg daily, metformin ER 1500 mg daily  See history of present illness for detailed discussion of current diabetes management, blood sugar patterns and problems identified  A1c is excellent at 6.4 Despite his long history of diabetes he has relatively stable control with minimal medications Able to maintain his weight with BMI 27  Recommendations:  Start checking blood sugars by rotation 2 hours after meals and only periodically fasting, does not need to check after meals He will continue metformin and Actos unchanged Encouraged him to start walking for exercise or  other activities on his days off  History of hyperthyroidism: He is in remission off methimazole, negative thyrotropin receptor antibody last year and does not need any further follow-up unless symptomatic  LIPIDS: Well-controlled  Hypertension: Well-controlled Sodium back to normal  Patient Instructions  Check blood sugars on waking up days a week  Also check blood sugars about 2 hours after meals and do this after different meals by rotation  Recommended blood sugar levels on waking up are 90-130 and about 2 hours after meal is 130-160  Please bring your blood sugar monitor to each visit, thank you    Elayne Snare 05/23/2022, 2:54 PM

## 2022-05-27 ENCOUNTER — Other Ambulatory Visit: Payer: Self-pay | Admitting: Nurse Practitioner

## 2022-05-27 NOTE — Telephone Encounter (Signed)
Dose dc'd 12/22/21 by Dr Dwyane Dee  Requested Prescriptions  Refused Prescriptions Disp Refills   atorvastatin (LIPITOR) 40 MG tablet [Pharmacy Med Name: ATORVASTATIN 40 MG TABLET] 90 tablet 3    Sig: TAKE 1 TABLET BY MOUTH EVERY DAY     Cardiovascular:  Antilipid - Statins Failed - 05/27/2022  1:30 AM      Failed - Valid encounter within last 12 months    Recent Outpatient Visits           1 year ago Pure hypercholesterolemia   [place] Eulogio Bear, NP   1 year ago Essential hypertension   [place], Jessica A, NP              Failed - Lipid Panel in normal range within the last 12 months    Cholesterol  Date Value Ref Range Status  01/21/2022 152 <200 mg/dL Final   LDL Cholesterol (Calc)  Date Value Ref Range Status  01/21/2022 82 mg/dL (calc) Final    Comment:    Reference range: <100 . Desirable range <100 mg/dL for primary prevention;   <70 mg/dL for patients with CHD or diabetic patients  with > or = 2 CHD risk factors. Marland Kitchen LDL-C is now calculated using the Martin-Hopkins  calculation, which is a validated novel method providing  better accuracy than the Friedewald equation in the  estimation of LDL-C.  Cresenciano Genre et al. Annamaria Helling. 3383;291(91): 2061-2068  (http://education.[facility].com/faq/FAQ164)    Direct LDL  Date Value Ref Range Status  05/19/2022 79.0 mg/dL Final    Comment:    Optimal:  <100 mg/dLNear or Above Optimal:  100-129 mg/dLBorderline High:  130-159 mg/dLHigh:  160-189 mg/dLVery High:  >190 mg/dL   HDL  Date Value Ref Range Status  01/21/2022 41 > OR = 40 mg/dL Final   Triglycerides  Date Value Ref Range Status  01/21/2022 197 (H) <150 mg/dL Final         Passed - Patient is not pregnant

## 2022-06-07 ENCOUNTER — Other Ambulatory Visit: Payer: Self-pay | Admitting: Nurse Practitioner

## 2022-06-09 NOTE — Telephone Encounter (Signed)
No longer current dosing on this medication Requested Prescriptions  Pending Prescriptions Disp Refills   atorvastatin (LIPITOR) 40 MG tablet [Pharmacy Med Name: ATORVASTATIN 40 MG TABLET] 90 tablet 3    Sig: TAKE 1 TABLET BY MOUTH EVERY DAY     Cardiovascular:  Antilipid - Statins Failed - 06/07/2022 11:18 AM      Failed - Valid encounter within last 12 months    Recent Outpatient Visits           1 year ago Pure hypercholesterolemia   [place] Eulogio Bear, NP   1 year ago Essential hypertension   [place] Noemi Chapel A, NP              Failed - Lipid Panel in normal range within the last 12 months    Cholesterol  Date Value Ref Range Status  01/21/2022 152 <200 mg/dL Final   LDL Cholesterol (Calc)  Date Value Ref Range Status  01/21/2022 82 mg/dL (calc) Final    Comment:    Reference range: <100 . Desirable range <100 mg/dL for primary prevention;   <70 mg/dL for patients with CHD or diabetic patients  with > or = 2 CHD risk factors. Marland Kitchen LDL-C is now calculated using the Martin-Hopkins  calculation, which is a validated novel method providing  better accuracy than the Friedewald equation in the  estimation of LDL-C.  Cresenciano Genre et al. Annamaria Helling. 9741;638(45): 2061-2068  (http://education.[facility].com/faq/FAQ164)    Direct LDL  Date Value Ref Range Status  05/19/2022 79.0 mg/dL Final    Comment:    Optimal:  <100 mg/dLNear or Above Optimal:  100-129 mg/dLBorderline High:  130-159 mg/dLHigh:  160-189 mg/dLVery High:  >190 mg/dL   HDL  Date Value Ref Range Status  01/21/2022 41 > OR = 40 mg/dL Final   Triglycerides  Date Value Ref Range Status  01/21/2022 197 (H) <150 mg/dL Final         Passed - Patient is not pregnant

## 2022-06-10 ENCOUNTER — Other Ambulatory Visit: Payer: Self-pay | Admitting: Nurse Practitioner

## 2022-06-10 NOTE — Telephone Encounter (Signed)
Please advice?   Medication refill request has been discontinue  on 12/22/2021 by Elayne Snare, MD.

## 2022-06-11 ENCOUNTER — Other Ambulatory Visit: Payer: Self-pay | Admitting: Nurse Practitioner

## 2022-06-17 ENCOUNTER — Other Ambulatory Visit: Payer: Self-pay

## 2022-06-17 DIAGNOSIS — E7800 Pure hypercholesterolemia, unspecified: Secondary | ICD-10-CM

## 2022-06-17 MED ORDER — ATORVASTATIN CALCIUM 20 MG PO TABS: 20.0000 mg | ORAL_TABLET | Freq: Every day | ORAL | 3 refills | Status: DC

## 2022-06-17 NOTE — Telephone Encounter (Addendum)
Do you want to refill this request? Pls advice  Spoke w/FNP, ok to refill pt's request for Liptor. Pt aware Rx has been sent to pharmacy

## 2022-06-26 ENCOUNTER — Other Ambulatory Visit: Payer: Self-pay | Admitting: Endocrinology

## 2022-06-26 DIAGNOSIS — E119 Type 2 diabetes mellitus without complications: Secondary | ICD-10-CM

## 2022-07-18 ENCOUNTER — Other Ambulatory Visit: Payer: Self-pay | Admitting: Family Medicine

## 2022-08-21 ENCOUNTER — Other Ambulatory Visit: Payer: Self-pay | Admitting: Endocrinology

## 2022-09-19 ENCOUNTER — Other Ambulatory Visit (INDEPENDENT_AMBULATORY_CARE_PROVIDER_SITE_OTHER): Payer: 59

## 2022-09-19 DIAGNOSIS — E119 Type 2 diabetes mellitus without complications: Secondary | ICD-10-CM

## 2022-09-19 LAB — BASIC METABOLIC PANEL
BUN: 19 mg/dL (ref 6–23)
CO2: 29 mEq/L (ref 19–32)
Calcium: 9.6 mg/dL (ref 8.4–10.5)
Chloride: 99 mEq/L (ref 96–112)
Creatinine, Ser: 1.27 mg/dL (ref 0.40–1.50)
GFR: 59.09 mL/min — ABNORMAL LOW (ref >60.00)
Glucose, Bld: 98 mg/dL (ref 70–99)
Potassium: 4.5 mEq/L (ref 3.5–5.1)
Sodium: 135 mEq/L (ref 135–145)

## 2022-09-19 LAB — MICROALBUMIN / CREATININE URINE RATIO
Creatinine,U: 76 mg/dL
Microalb Creat Ratio: 0.9 mg/g (ref 0.0–30.0)
Microalb, Ur: <0.7 mg/dL (ref 0.0–1.9)

## 2022-09-19 LAB — HEMOGLOBIN A1C: Hgb A1c MFr Bld: 6.4 % (ref 4.6–6.5)

## 2022-09-23 ENCOUNTER — Ambulatory Visit: Payer: 59 | Admitting: Endocrinology

## 2022-09-23 ENCOUNTER — Encounter: Payer: Self-pay | Admitting: Endocrinology

## 2022-09-23 VITALS — BP 124/80 | HR 62 | Ht 66.0 in | Wt 172.0 lb

## 2022-09-23 DIAGNOSIS — Z8639 Personal history of other endocrine, nutritional and metabolic disease: Secondary | ICD-10-CM | POA: Diagnosis not present

## 2022-09-23 DIAGNOSIS — I10 Essential (primary) hypertension: Secondary | ICD-10-CM | POA: Diagnosis not present

## 2022-09-23 DIAGNOSIS — Z7984 Long term (current) use of oral hypoglycemic drugs: Secondary | ICD-10-CM

## 2022-09-23 DIAGNOSIS — E119 Type 2 diabetes mellitus without complications: Secondary | ICD-10-CM

## 2022-09-23 NOTE — Patient Instructions (Signed)
Check blood sugars on waking up 2-3 days a week  Also check blood sugars about 2 hours after meals and do this after different meals by rotation  Recommended blood sugar levels on waking up are 90-130 and about 2 hours after meal is 130-160  Please bring your blood sugar monitor to each visit, thank you  Check on thyroid Rx

## 2022-09-23 NOTE — Progress Notes (Signed)
Patient ID: Andrew Morrow, male   DOB: 05-06-1956, 67 y.o.   MRN: [id number]           Reason for Appointment: Type II Diabetes follow-up   History of Present Illness   Diagnosis date: 2011  Previous history:  Non-insulin hypoglycemic drugs previously used: Metformin, Actos Actos was started in 11/21 Insulin was used for a few years between 2011 and 2014 but appeared to be taking only very small doses totaling 5 units of Lantus and 4 units of Humalog at meals At that time his weight was about the same  A1c range in the last few years is: 5.6-7.4  Recent history:     Non-insulin hypoglycemic drugs: Metformin ER 1500 mg, Actos 15 mg daily     Side effects from medications: None  Current self management, blood sugar patterns and problems identified:  A1c is at the same at 6.4 He is checking his blood sugars only after lunch and forgets to do it at other times He said that he is not eating a large meal in the evening and lunch may be his largest meal As before he has been continuing to use metformin and Actos without side effects Weight is about the same He is mostly active at work and trying to be active on his own but no formal exercise Blood sugars are excellent at home Also lab glucose was 98 fasting  Exercise: Mostly walking at work regularly     Hypoglycemia:  none    Glucometer: One Touch ultra 2.           Blood Glucose readings from home monitor download:   PRE-MEAL Fasting Lunch Dinner Bedtime Overall  Glucose range: ?      Mean/median:        POST-MEAL PC Breakfast PC Lunch PC Dinner  Glucose range:  91-141   Mean/median:  105     Previously:  PRE-MEAL Fasting Lunch Dinner Bedtime Overall  Glucose range: 101-115  84-93    Mean/median:     ?   POST-MEAL PC Breakfast PC Lunch PC Dinner  Glucose range: 124, 125  180  Mean/median:      Dietician visit: Most recent: None     Weight control:  Wt Readings from Last 3 Encounters:  09/23/22 172 lb  (78 kg)  05/23/22 170 lb 6.4 oz (77.3 kg)  01/21/22 174 lb (78.9 kg)            Diabetes labs:  Lab Results  Component Value Date   HGBA1C 6.4 09/19/2022   HGBA1C 6.4 05/19/2022   HGBA1C 6.5 12/18/2021   Lab Results  Component Value Date   MICROALBUR <0.7 09/19/2022   LDLCALC 82 01/21/2022   CREATININE 1.27 09/19/2022    No results found for: "FRUCTOSAMINE"   Allergies as of 09/23/2022   No Known Allergies      Medication List        Accurate as of Sep 23, 2022  8:08 AM. If you have any questions, ask your nurse or doctor.          acetaminophen 500 MG tablet Commonly known as: TYLENOL Take 500 mg by mouth every 6 (six) hours as needed for moderate pain.   atorvastatin 20 MG tablet Commonly known as: LIPITOR Take 1 tablet (20 mg total) by mouth daily.   lisinopril-hydrochlorothiazide 20-25 MG tablet Commonly known as: ZESTORETIC TAKE 1 TABLET BY MOUTH EVERY DAY   metFORMIN 500 MG 24 hr tablet Commonly known as: GLUCOPHAGE-XR  Take 3 tablets (1,500 mg total) by mouth daily.   multivitamin tablet Take 1 tablet by mouth every morning.   OneTouch Ultra test strip Generic drug: glucose blood USE AS DIRECTED   pioglitazone 15 MG tablet Commonly known as: ACTOS TAKE 1 TABLET BY MOUTH EVERY DAY   sildenafil 100 MG tablet Commonly known as: VIAGRA TAKE 1 TABLET (100 MG TOTAL) BY MOUTH DAILY AS NEEDED.        Allergies: No Known Allergies  Past Medical History:  Diagnosis Date   Adenoma of colon    Cataract    forming    Chronic kidney disease    left upper pole stone   Diabetes mellitus without complication ([facility])    diet controlled- pt states uses lants PRN only- no shot in 8 months   Encounter for long-term (current) use of other medications 06/01/2012   Foot pain, left 03/07/2019   Hematuria 07/27/2014   HTN (hypertension)    Hyperlipidemia    Hyperthyroidism    Routine general medical examination at a health care facility 12/01/2012    Screening for cancer 07/27/2014   Screening for prostate cancer 06/01/2012   Unspecified disorder of liver 02/13/2010   Qualifier: Diagnosis of  By: Everardo All MD, Cleophas Dunker     Past Surgical History:  Procedure Laterality Date   COLONOSCOPY     HERNIA REPAIR     KIDNEY STONE SURGERY     POLYPECTOMY     TONSILLECTOMY     VASECTOMY      Family History  Problem Relation Age of Onset   Throat cancer Mother        heavy smoker    Esophageal cancer Mother    Other Son        Committed Suicide   Diabetes Mellitus I Brother    Colon cancer Neg Hx    Colon polyps Neg Hx    Rectal cancer Neg Hx    Stomach cancer Neg Hx     Social History:  reports that he has never smoked. He has quit using smokeless tobacco.  His smokeless tobacco use included chew. He reports that he does not drink alcohol and does not use drugs.  Review of Systems:  HYPERTHYROIDISM:  About initially about the year 2000  he apparently had routine labs indicating hyperthyroidism but does not remember having any symptoms He was told by his treating physician at that time to take treatment with I-131 Oldest lab results available show TSH of 0.03 and free T4 was 1.4 as of 12/2005 Also he does not think he had radioactive iodine uptake or scan done  Apparently with his previous endocrinologist he had been treated long-term with methimazole 10 mg daily although was off treatment between 2005 and 2007 He did not feel any different with starting methimazole  With his thyroid functions being normal as well as absent thyrotropin antibody in 8/23 he was supposed to stop methimazole at that time He is unclear whether he is taking his medication now or not  Thyroid levels have stayed normal consistently  Lab Results  Component Value Date   TSH 2.93 05/19/2022   TSH 2.75 12/18/2021   TSH 3.22 05/24/2021   FREET4 0.76 05/19/2022   FREET4 0.96 12/18/2021   FREET4 0.79 05/24/2021   Lab Results  Component Value Date    THYROTRECAB <1.10 12/18/2021     Last diabetic eye exam date 05/13/2022  Last urine microalbumin date: 12/22  Last foot exam date: 1/24  Symptoms of neuropathy: None Other complications: Erectile dysfunction treated with sildenafil  Hypertension:   ACE/ARB medication: Lisinopril 20 mg  BP Readings from Last 3 Encounters:  09/23/22 124/80  05/23/22 130/70  01/21/22 130/68    Lipid management: Taking Lipitor 20 mg daily prescribed by PCP    Lab Results  Component Value Date   CHOL 152 01/21/2022   CHOL 159 04/11/2021   CHOL 199 10/04/2020   Lab Results  Component Value Date   HDL 41 01/21/2022   HDL 48 04/11/2021   HDL 47 10/04/2020   Lab Results  Component Value Date   LDLCALC 82 01/21/2022   LDLCALC 85 04/11/2021   LDLCALC 118 (H) 10/04/2020   Lab Results  Component Value Date   TRIG 197 (H) 01/21/2022   TRIG 164 (H) 04/11/2021   TRIG 220 (H) 10/04/2020   Lab Results  Component Value Date   CHOLHDL 3.7 01/21/2022   CHOLHDL 3.3 04/11/2021   CHOLHDL 4.2 10/04/2020   Lab Results  Component Value Date   LDLDIRECT 79.0 05/19/2022   LDLDIRECT 110.9 06/02/2012     Examination:   BP 124/80 (BP Location: Left Arm, Patient Position: Sitting, Cuff Size: Small)   Pulse 62   Ht 5\' 6"  (1.676 m)   Wt 172 lb (78 kg)   SpO2 97%   BMI 27.76 kg/m   Body mass index is 27.76 kg/m.   No ankle edema present  ASSESSMENT/ PLAN:   Diabetes type 2 nonobese  Current regimen: Actos 15 mg daily, metformin ER 1500 mg daily  See history of present illness for detailed discussion of current diabetes management, blood sugar patterns and problems identified  A1c is still well-controlled at 6.4  He has had no progression of his diabetes with excellent control with only metformin and Actos as before Blood sugars are excellent at home  With fairly good lifestyle he is able to maintain his weight with BMI 27-28  Recommendations:  Start checking blood sugars at  different times by rotation and discussed blood sugar targets after meals and in the morning No change in medication Regular exercise on the days he is not working  Urine microalbumin normal  HYPERTENSION: Well-controlled with lisinopril HCT, he will continue same dose; no problems with hyponatremia now  History of hyperthyroidism: He is in remission and he will confirm that he has stopped methimazole He had a negative thyrotropin receptor antibody last year and consistently normal thyroid levels    There are no Patient Instructions on file for this visit.   Reather Littler 09/23/2022, 8:08 AM

## 2022-11-12 IMAGING — US US RENAL
1 series · 14 of 25 positions shown · non-contrast
Comparison: 11/14/2014

CLINICAL DATA: Decreased GFR

EXAM:
RENAL / URINARY TRACT ULTRASOUND COMPLETE

[Series 1: us renal · 0.23mm/px · 14 of 48 slices shown]
[im 1/48]
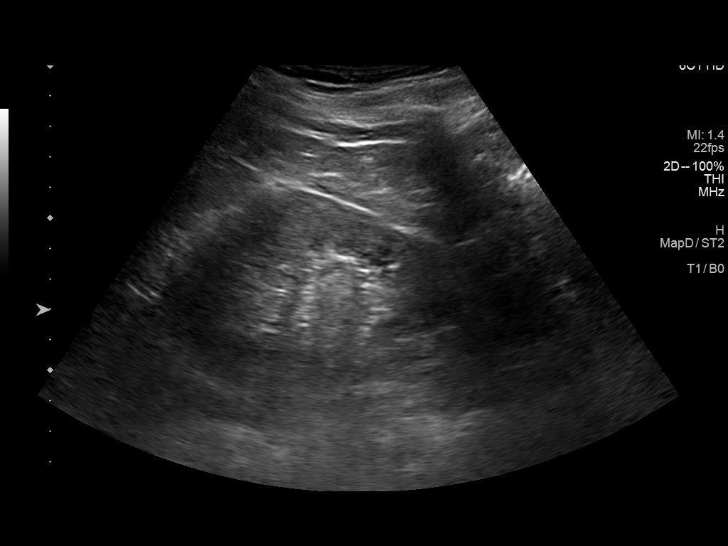
[im 4/48]
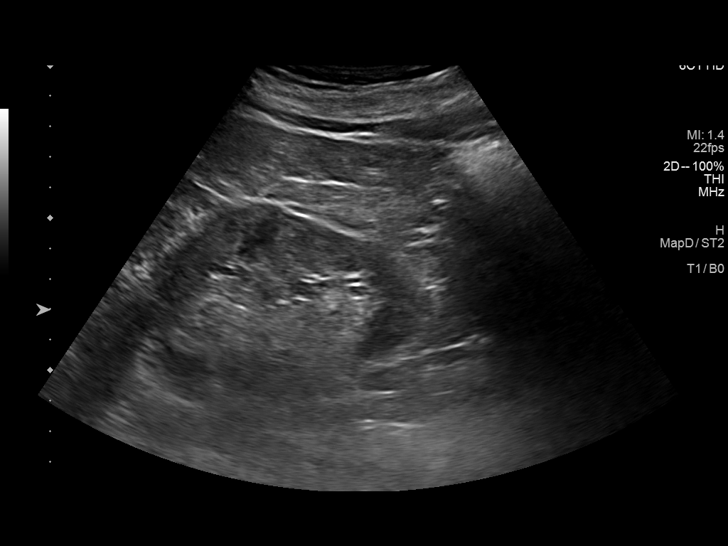
[im 8/48]
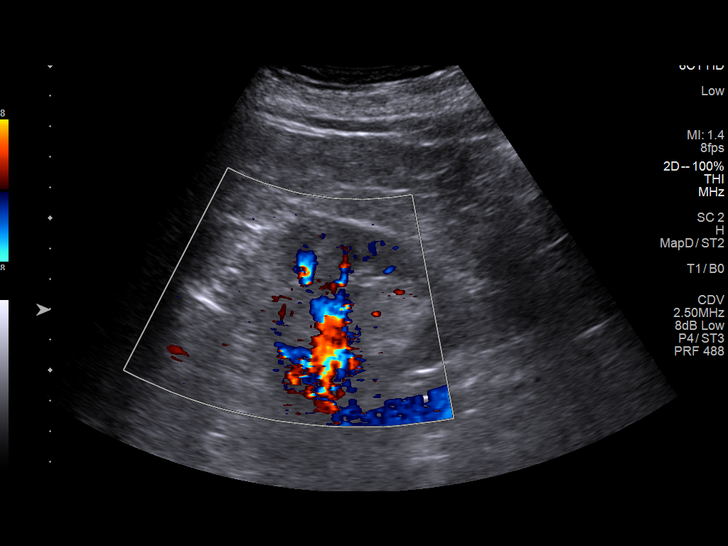
[im 12/48]
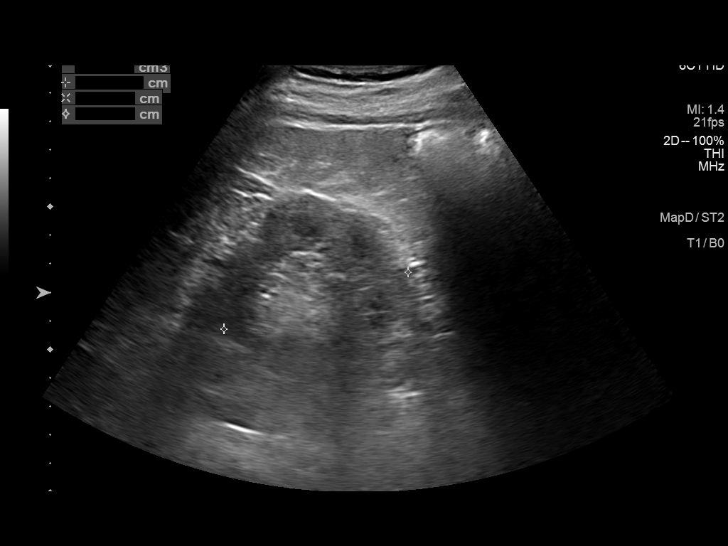
[im 16/48]
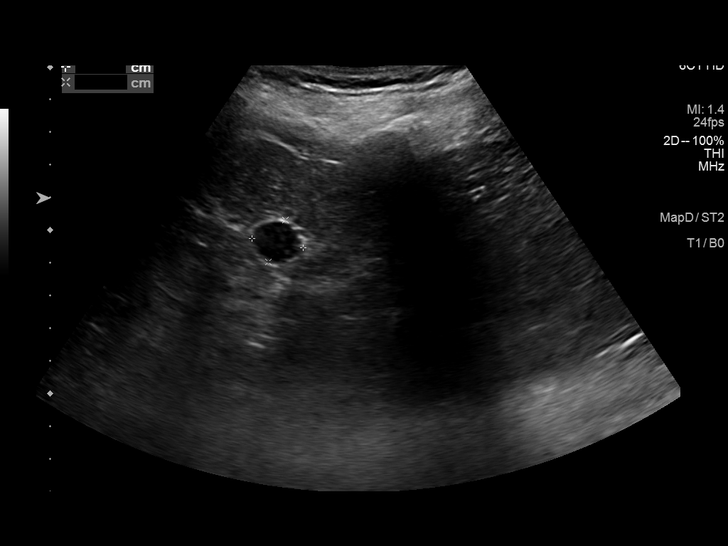
[im 18/48]
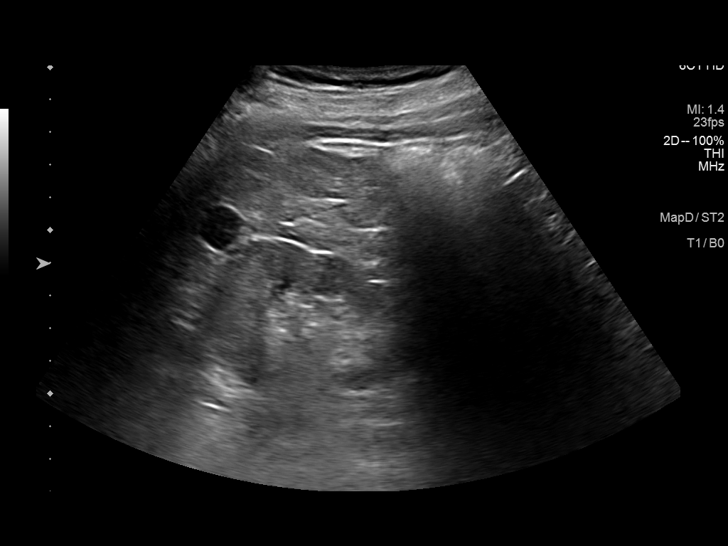
[im 22/48]
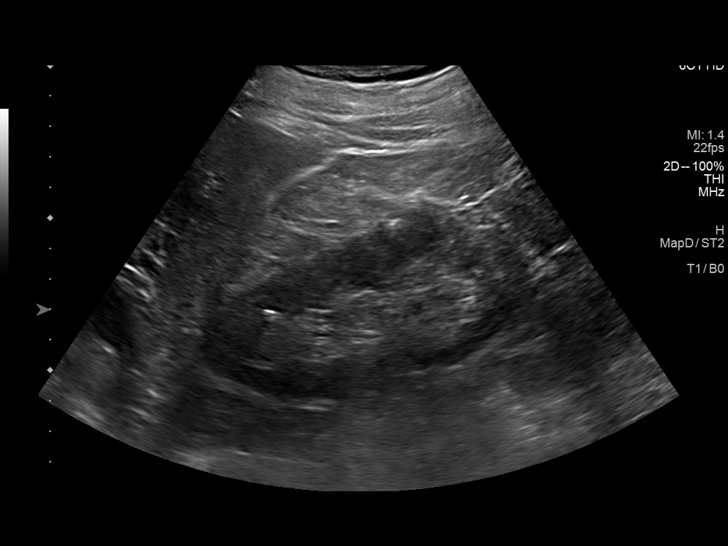
[im 26/48]
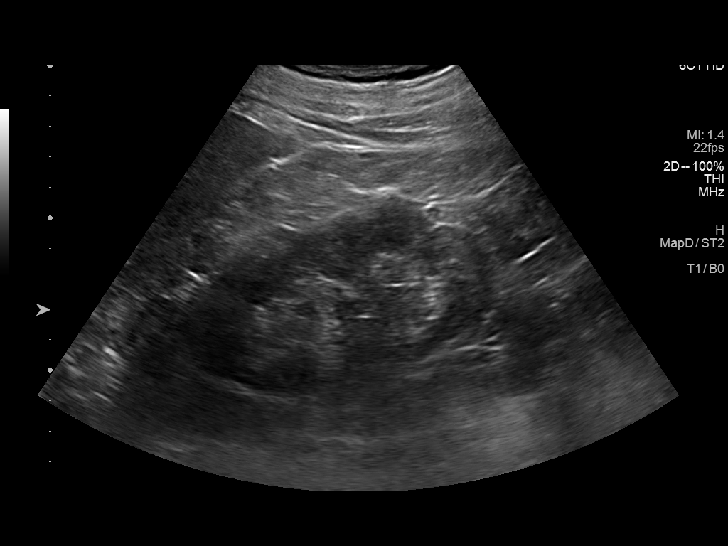
[im 30/48]
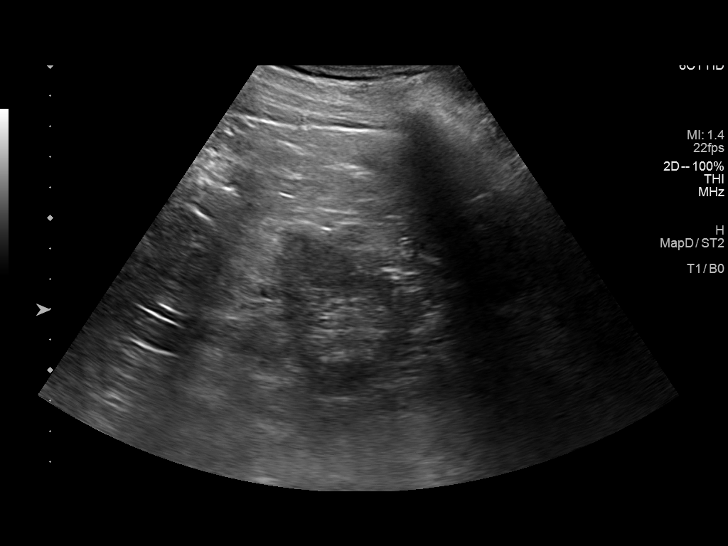
[im 32/48]
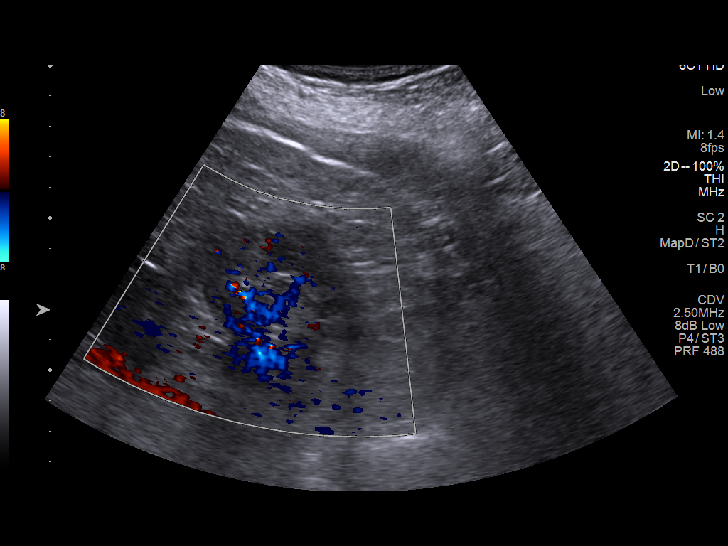
[im 36/48]
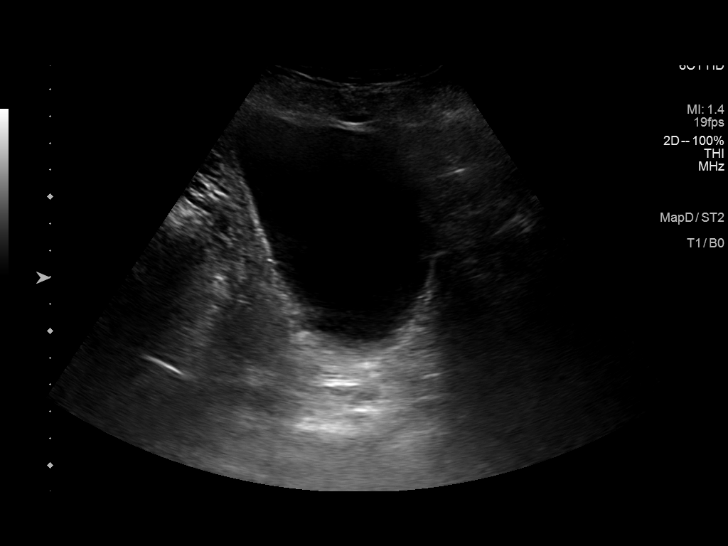
[im 40/48]
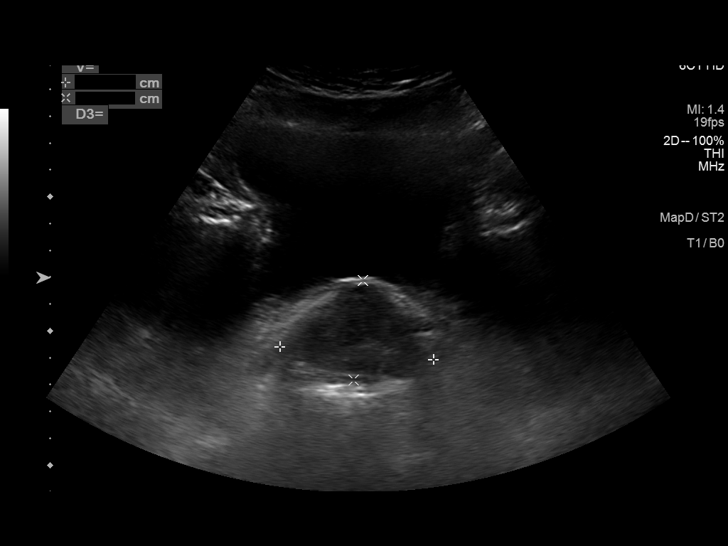
[im 44/48]
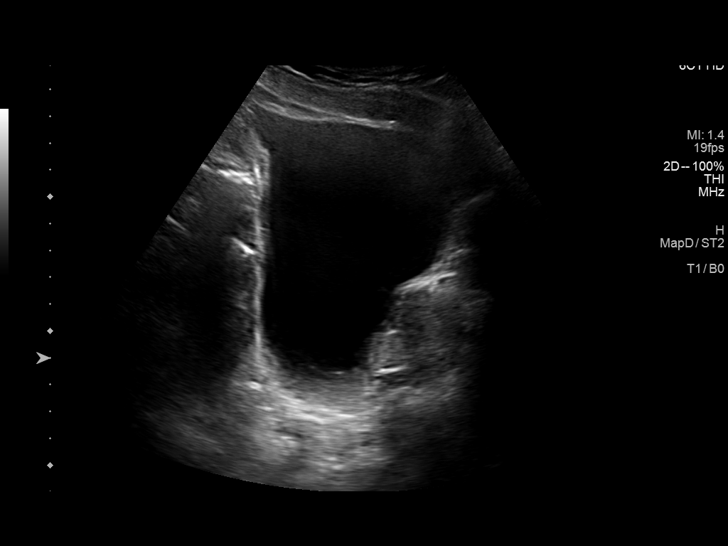
[im 48/48]
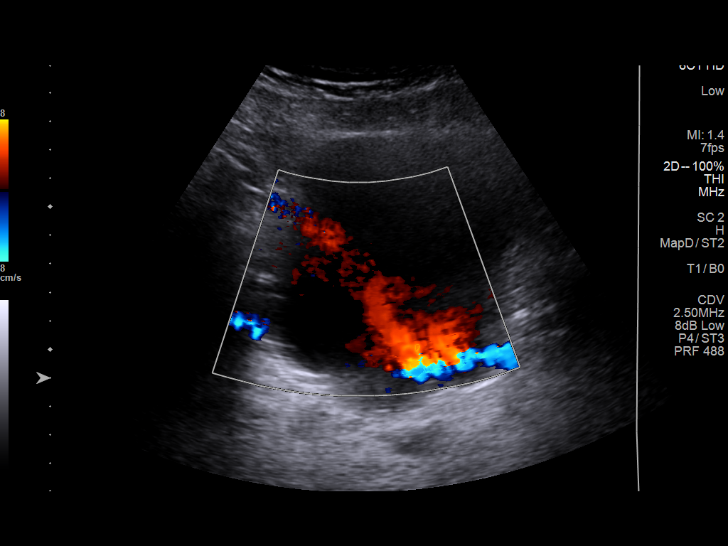

[14 of 25 positions shown; findings below may reference images not displayed]

FINDINGS: Right Kidney:

Renal measurements: 10.4 x 5.9 x 6.8 cm = volume: 219 mL. Normal
echogenicity and renal cortical thickness. No focal abnormality or
hydronephrosis. Posterior exophytic anechoic lower pole cyst
measures 1.6 cm.

Left Kidney:

Renal measurements: 10.6 x 4.9 x 5.4 cm = volume: 146 mL.
Echogenicity within normal limits. No mass or hydronephrosis
visualized.

Bladder:

Appears normal for degree of bladder distention.

Other:

Prostate measures 5.3 x 3.7 x 5.7 cm impressing upon the bladder
base.
IMPRESSION: No acute finding by renal ultrasound.  Negative for hydronephrosis.

Incidental 1.6 cm exophytic right lower pole renal cyst

Mild prostate enlargement

## 2022-11-20 ENCOUNTER — Other Ambulatory Visit: Payer: Self-pay | Admitting: Family Medicine

## 2022-11-20 ENCOUNTER — Other Ambulatory Visit: Payer: Self-pay | Admitting: Nurse Practitioner

## 2022-11-20 ENCOUNTER — Other Ambulatory Visit: Payer: Self-pay | Admitting: Endocrinology

## 2022-11-20 DIAGNOSIS — E119 Type 2 diabetes mellitus without complications: Secondary | ICD-10-CM

## 2022-11-20 NOTE — Telephone Encounter (Signed)
Requested medication (s) are due for refill today:   Requested medication (s) are on the active medication list: Yes  Last refill:    Future visit scheduled: No  Notes to clinic:  See request.    Requested Prescriptions  Pending Prescriptions Disp Refills   atorvastatin (LIPITOR) 40 MG tablet [Pharmacy Med Name: ATORVASTATIN 40 MG TABLET] 90 tablet 3    Sig: TAKE 1 TABLET BY MOUTH EVERY DAY     Cardiovascular:  Antilipid - Statins Failed - 11/20/2022  6:04 AM      Failed - Valid encounter within last 12 months    Recent Outpatient Visits           1 year ago Pure hypercholesterolemia   [facility] Valentino Nose, NP   2 years ago Essential hypertension   [facility] Cathlean Marseilles A, NP              Failed - Lipid Panel in normal range within the last 12 months    Cholesterol  Date Value Ref Range Status  01/21/2022 152 <200 mg/dL Final   LDL Cholesterol (Calc)  Date Value Ref Range Status  01/21/2022 82 mg/dL (calc) Final    Comment:    Reference range: <100 . Desirable range <100 mg/dL for primary prevention;   <70 mg/dL for patients with CHD or diabetic patients  with > or = 2 CHD risk factors. Marland Kitchen LDL-C is now calculated using the Martin-Hopkins  calculation, which is a validated novel method providing  better accuracy than the Friedewald equation in the  estimation of LDL-C.  Horald Pollen et al. Lenox Ahr. 1610;960(45): 2061-2068  (http://education.[facility].com/faq/FAQ164)    Direct LDL  Date Value Ref Range Status  05/19/2022 79.0 mg/dL Final    Comment:    Optimal:  <100 mg/dLNear or Above Optimal:  100-129 mg/dLBorderline High:  130-159 mg/dLHigh:  160-189 mg/dLVery High:  >190 mg/dL   HDL  Date Value Ref Range Status  01/21/2022 41 > OR = 40 mg/dL Final   Triglycerides  Date Value Ref Range Status  01/21/2022 197 (H) <150 mg/dL Final         Passed - Patient is not pregnant

## 2022-11-27 ENCOUNTER — Telehealth: Payer: Self-pay

## 2022-11-27 NOTE — Telephone Encounter (Signed)
FYI: Called pt this morning and spoke pt's wife,Kathy, re: atorvastatin dose?  Told wife that per FNP that it should be ok for pt to take the most recent dose of 40mg . Wife is questioning the reason for the increase? Also, stated that pt doesn't like to take meds. Advice pt to then continue w/the 20mg  and discuss w/FNP on 12/04/22. Wife agrees and nothing further.

## 2022-11-28 NOTE — Telephone Encounter (Signed)
Disregard, entry error

## 2022-12-04 ENCOUNTER — Ambulatory Visit: Payer: 59 | Admitting: Family Medicine

## 2022-12-14 ENCOUNTER — Other Ambulatory Visit: Payer: Self-pay | Admitting: Family Medicine

## 2023-01-19 ENCOUNTER — Other Ambulatory Visit: Payer: 59

## 2023-01-20 ENCOUNTER — Other Ambulatory Visit: Payer: 59

## 2023-01-20 DIAGNOSIS — Z0000 Encounter for general adult medical examination without abnormal findings: Secondary | ICD-10-CM

## 2023-01-26 ENCOUNTER — Encounter: Payer: Self-pay | Admitting: Family Medicine

## 2023-01-26 ENCOUNTER — Ambulatory Visit: Payer: 59 | Admitting: Family Medicine

## 2023-01-26 VITALS — BP 132/70 | HR 70 | Temp 97.8°F | Ht 66.0 in | Wt 170.0 lb

## 2023-01-26 DIAGNOSIS — E7800 Pure hypercholesterolemia, unspecified: Secondary | ICD-10-CM | POA: Diagnosis not present

## 2023-01-26 DIAGNOSIS — Z23 Encounter for immunization: Secondary | ICD-10-CM

## 2023-01-26 DIAGNOSIS — Z0001 Encounter for general adult medical examination with abnormal findings: Secondary | ICD-10-CM

## 2023-01-26 DIAGNOSIS — E1136 Type 2 diabetes mellitus with diabetic cataract: Secondary | ICD-10-CM

## 2023-01-26 DIAGNOSIS — I10 Essential (primary) hypertension: Secondary | ICD-10-CM | POA: Diagnosis not present

## 2023-01-26 DIAGNOSIS — Z7984 Long term (current) use of oral hypoglycemic drugs: Secondary | ICD-10-CM

## 2023-01-26 DIAGNOSIS — Z125 Encounter for screening for malignant neoplasm of prostate: Secondary | ICD-10-CM

## 2023-01-26 MED ORDER — ATORVASTATIN CALCIUM 80 MG PO TABS: 80.0000 mg | ORAL_TABLET | Freq: Every day | ORAL | 1 refills | Status: AC

## 2023-01-26 NOTE — Assessment & Plan Note (Addendum)
LDL not at goal <70. Increase Atorvastatin to 80mg  daily. I recommend consuming a heart healthy diet such as Mediterranean diet or DASH diet with whole grains, fruits, vegetable, fish, lean meats, nuts, and olive oil. Limit sweets and processed foods. I also encourage moderate intensity exercise 150 minutes weekly. This is 3-5 times weekly for 30-50 minutes each session. Goal should be pace of 3 miles/hours, or walking 1.5 miles in 30 minutes. The 10-year ASCVD risk score (Arnett DK, et al., 2019) is: 23.8%

## 2023-01-26 NOTE — Assessment & Plan Note (Signed)
Chronic.  A1c goal less than 7%. Followed by Endo. Continue Metformin 1500mg  daily and Actos 15mg  daily. Encouraged continuing to check blood sugars and notify me if readings elevated >120. HM items up to date.

## 2023-01-26 NOTE — Progress Notes (Signed)
Complete physical exam  Patient: Andrew Morrow   DOB: 1955-05-14   67 y.o. Male  MRN: [id number]  Subjective:    Chief Complaint  Patient presents with   Annual Exam    Andrew Morrow is a 67 y.o. male who presents today for a complete physical exam. He reports consuming a general diet. The patient has a physically strenuous job, but has no regular exercise apart from work.  He generally feels well. He reports sleeping well. He does not have additional problems to discuss today. He does see Endocrinology and Ophthalmology.  DIABETES Hypoglycemic episodes:no Polydipsia/polyuria: no Visual disturbance: no Chest pain: no Paresthesias: no Glucose Monitoring: yes  Accucheck frequency: Daily  Fasting glucose: 85-100  Post prandial:  Evening:  Before meals: Taking Insulin?: no  Long acting insulin:  Short acting insulin: Blood Pressure Monitoring: not checking Retinal Examination: Up to Date Foot Exam: Up to Date Diabetic Education: Completed Pneumovax: Not up to Date Influenza: Not up to Date Aspirin: no  HYPERTENSION without Chronic Kidney Disease Hypertension status: controlled  Satisfied with current treatment? yes Duration of hypertension: chronic BP monitoring frequency:  not checking BP range:  BP medication side effects:  no Medication compliance: good compliance Previous BP meds:lisinopril-HCTZ Aspirin: no Recurrent headaches: no Visual changes: no Palpitations: no Dyspnea: no Chest pain: no Lower extremity edema: no Dizzy/lightheaded: no    Most recent fall risk assessment:    01/21/2022    2:47 PM  Fall Risk   Falls in the past year? 0  Number falls in past yr: 0  Injury with Fall? 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   Graves' disease 10/03/2020   Polyp of colon 10/03/2020   Urolithiasis 12/19/2014   Diabetes (HCC) 07/27/2014   HYPERCHOLESTEROLEMIA 02/13/2010   HYPOGONADISM 08/15/2008   Thyrotoxicosis 06/29/2007   Essential hypertension 06/29/2007   Past Medical History:  Diagnosis Date   Adenoma of colon    Cataract    forming    Chronic kidney disease    left upper pole stone   Diabetes mellitus without complication (HCC)    diet controlled- pt states uses lants PRN only- no shot in 8 months   Encounter for long-term (current) use of other medications 06/01/2012   Foot pain, left 03/07/2019   Hematuria 07/27/2014   HTN (hypertension)    Hyperlipidemia    Hyperthyroidism    Routine general medical examination at a health care facility 12/01/2012   Screening for cancer 07/27/2014   Screening for prostate cancer 06/01/2012   Unspecified disorder of liver 02/13/2010   Qualifier: Diagnosis of  By: Everardo All MD, Cleophas Dunker    Past Surgical History:  Procedure Laterality Date   COLONOSCOPY     HERNIA REPAIR     KIDNEY STONE SURGERY     POLYPECTOMY     TONSILLECTOMY     VASECTOMY     Social History   Tobacco Use   Smoking status: Never   Smokeless tobacco: Former    Types: Chew  Substance Use Topics   Alcohol use: No    Alcohol/week: 0.0 standard drinks of alcohol  Drug use: No   Family Status  Relation Name Status   Mother  Deceased   Father  Deceased   Son  (Not Specified)   Brother  (Not Specified)   Neg Hx  (Not Specified)  No partnership data on file   Family History  Problem Relation Age of Onset   Throat cancer Mother        heavy smoker    Esophageal cancer Mother    Other Son        Committed Suicide   Diabetes Mellitus I Brother    Colon cancer Neg Hx    Colon polyps Neg Hx    Rectal cancer Neg Hx    Stomach cancer Neg Hx    No Known Allergies    Patient Care Team: Park Meo, FNP as PCP -  General (Family Medicine)   Outpatient Medications Prior to Visit  Medication Sig   acetaminophen (TYLENOL) 500 MG tablet Take 500 mg by mouth every 6 (six) hours as needed for moderate pain.   lisinopril-hydrochlorothiazide (ZESTORETIC) 20-25 MG tablet Take 1 tablet by mouth daily.   metFORMIN (GLUCOPHAGE-XR) 500 MG 24 hr tablet Take 3 tablets (1,500 mg total) by mouth daily.   Multiple Vitamin (MULTIVITAMIN) tablet Take 1 tablet by mouth every morning.    ONETOUCH ULTRA test strip USE AS DIRECTED   pioglitazone (ACTOS) 15 MG tablet TAKE 1 TABLET BY MOUTH EVERY DAY   sildenafil (VIAGRA) 100 MG tablet TAKE 1 TABLET (100 MG TOTAL) BY MOUTH DAILY AS NEEDED.   [DISCONTINUED] atorvastatin (LIPITOR) 40 MG tablet TAKE 1 TABLET BY MOUTH EVERY DAY   No facility-administered medications prior to visit.    Review of Systems  Constitutional: Negative.   HENT: Negative.    Eyes: Negative.   Respiratory: Negative.    Cardiovascular: Negative.   Gastrointestinal: Negative.   Genitourinary: Negative.   Musculoskeletal: Negative.   Skin: Negative.   Neurological: Negative.   Endo/Heme/Allergies: Negative.   Psychiatric/Behavioral: Negative.    All other systems reviewed and are negative.         Objective:     BP 132/70   Pulse 70   Temp 97.8 F (36.6 C) (Oral)   Ht 5\' 6"  (1.676 m)   Wt 170 lb (77.1 kg)   SpO2 97%   BMI 27.44 kg/m  BP Readings from Last 3 Encounters:  01/26/23 132/70  09/23/22 124/80  05/23/22 130/70   Wt Readings from Last 3 Encounters:  01/26/23 170 lb (77.1 kg)  09/23/22 172 lb (78 kg)  05/23/22 170 lb 6.4 oz (77.3 kg)      Physical Exam Vitals and nursing note reviewed.  Constitutional:      Appearance: Normal appearance. He is overweight.  HENT:     Head: Normocephalic and atraumatic.     Right Ear: Tympanic membrane, ear canal and external ear normal.     Left Ear: Tympanic membrane, ear canal and external ear normal.     Nose: Nose normal.      Mouth/Throat:     Mouth: Mucous membranes are moist.     Pharynx: Oropharynx is clear.  Eyes:     Extraocular Movements: Extraocular movements intact.     Right eye: Normal extraocular motion and no nystagmus.     Left eye: Normal extraocular motion and no nystagmus.     Conjunctiva/sclera: Conjunctivae normal.     Pupils: Pupils are equal, round, and reactive to light.  Cardiovascular:  Rate and Rhythm: Normal rate and regular rhythm.     Pulses: Normal pulses.     Heart sounds: Normal heart sounds.  Pulmonary:     Effort: Pulmonary effort is normal.     Breath sounds: Normal breath sounds.  Abdominal:     General: Bowel sounds are normal.     Palpations: Abdomen is soft.  Genitourinary:    Comments: Deferred using shared decision making Musculoskeletal:        General: Normal range of motion.     Cervical back: Normal range of motion and neck supple.  Skin:    General: Skin is warm and dry.     Capillary Refill: Capillary refill takes less than 2 seconds.  Neurological:     General: No focal deficit present.     Mental Status: He is alert. Mental status is at baseline.  Psychiatric:        Mood and Affect: Mood normal.        Speech: Speech normal.        Behavior: Behavior normal.        Thought Content: Thought content normal.        Cognition and Memory: Cognition and memory normal.        Judgment: Judgment normal.      No results found for any visits on 01/26/23. Last CBC Lab Results  Component Value Date   WBC 7.7 01/20/2023   HGB 14.8 01/20/2023   HCT 43.8 01/20/2023   MCV 89.6 01/20/2023   MCH 30.3 01/20/2023   RDW 12.2 01/20/2023   PLT 200 01/20/2023   Last metabolic panel Lab Results  Component Value Date   GLUCOSE 101 (H) 01/20/2023   NA 139 01/20/2023   K 4.8 01/20/2023   CL 101 01/20/2023   CO2 27 01/20/2023   BUN 23 01/20/2023   CREATININE 1.21 01/20/2023   EGFR 66 01/20/2023   CALCIUM 9.6 01/20/2023   PROT 7.0 01/20/2023    ALBUMIN 4.3 12/18/2021   BILITOT 0.5 01/20/2023   ALKPHOS 84 12/18/2021   AST 17 01/20/2023   ALT 13 01/20/2023   Last lipids Lab Results  Component Value Date   CHOL 167 01/20/2023   HDL 48 01/20/2023   LDLCALC 93 01/20/2023   LDLDIRECT 79.0 05/19/2022   TRIG 154 (H) 01/20/2023   CHOLHDL 3.5 01/20/2023   Last hemoglobin A1c Lab Results  Component Value Date   HGBA1C 6.4 09/19/2022   Last thyroid functions Lab Results  Component Value Date   TSH 2.93 05/19/2022   Last vitamin D No results found for: "25OHVITD2", "25OHVITD3", "VD25OH" Last vitamin B12 and Folate No results found for: "VITAMINB12", "FOLATE"      Assessment & Plan:    Routine Health Maintenance and Physical Exam  Immunization History  Administered Date(s) Administered   Influenza,inj,Quad PF,6+ Mos 03/08/2013, 01/26/2014, 03/29/2015, 03/26/2016, 03/08/2018, 03/07/2019, 03/08/2020, 04/12/2021, 01/22/2022   Influenza-Unspecified 03/09/2017, 02/10/2019   PFIZER(Purple Top)SARS-COV-2 Vaccination 07/23/2019, 08/15/2019, 03/21/2020   Pneumococcal Polysaccharide-23 12/01/2012   Td 08/11/2006   Tdap 08/19/2016   Zoster Recombinant(Shingrix) 10/11/2019, 12/28/2019    Health Maintenance  Topic Date Due   Pneumonia Vaccine 21+ Years old (2 of 2 - PCV) 09/24/2021   INFLUENZA VACCINE  12/11/2022   COVID-19 Vaccine (4 - 2023-24 season) 02/11/2023 (Originally 01/11/2023)   HEMOGLOBIN A1C  03/22/2023   OPHTHALMOLOGY EXAM  05/14/2023   FOOT EXAM  05/24/2023   Diabetic kidney evaluation - Urine ACR  09/19/2023   Diabetic kidney  evaluation - eGFR measurement  01/20/2024   Colonoscopy  01/21/2025   DTaP/Tdap/Td (3 - Td or Tdap) 08/20/2026   Hepatitis C Screening  Completed   Zoster Vaccines- Shingrix  Completed   HPV VACCINES  Aged Out    Discussed health benefits of physical activity, and encouraged him to engage in regular exercise appropriate for his age and condition.  Problem List Items Addressed This  Visit     HYPERCHOLESTEROLEMIA    LDL not at goal <70. Increase Atorvastatin to 80mg  daily. I recommend consuming a heart healthy diet such as Mediterranean diet or DASH diet with whole grains, fruits, vegetable, fish, lean meats, nuts, and olive oil. Limit sweets and processed foods. I also encourage moderate intensity exercise 150 minutes weekly. This is 3-5 times weekly for 30-50 minutes each session. Goal should be pace of 3 miles/hours, or walking 1.5 miles in 30 minutes. The 10-year ASCVD risk score (Arnett DK, et al., 2019) is: 23.8%       Relevant Medications   atorvastatin (LIPITOR) 80 MG tablet   Other Relevant Orders   Lipid panel   Essential hypertension - Primary    BP at goal. Continue Zestoretic 20-25mg  daily. Recommend heart healthy diet such as Mediterranean diet with whole grains, fruits, vegetable, fish, lean meats, nuts, and olive oil. Limit salt. Encouraged moderate walking, 3-5 times/week for 30-50 minutes each session. Aim for at least 150 minutes.week. Goal should be pace of 3 miles/hours, or walking 1.5 miles in 30 minutes. Avoid tobacco products. Avoid excess alcohol. Take medications as prescribed and bring medications and blood pressure log with cuff to each office visit. Seek medical care for chest pain, palpitations, shortness of breath with exertion, dizziness/lightheadedness, vision changes, recurrent headaches, or swelling of extremities.       Relevant Medications   atorvastatin (LIPITOR) 80 MG tablet   Other Relevant Orders   CBC with Differential/Platelet   COMPLETE METABOLIC PANEL WITH GFR   Lipid panel   Diabetes (HCC)    Chronic.  A1c goal less than 7%. Followed by Endo. Continue Metformin 1500mg  daily and Actos 15mg  daily. Encouraged continuing to check blood sugars and notify me if readings elevated >120. HM items up to date.      Relevant Medications   atorvastatin (LIPITOR) 80 MG tablet   Other Visit Diagnoses     Screening for prostate  cancer       Relevant Orders   PSA      Return in about 6 months (around 07/26/2023) for chronic follow-up with labs 1 week prior.     Park Meo, FNP

## 2023-01-26 NOTE — Assessment & Plan Note (Signed)
BP at goal. Continue Zestoretic 20-25mg  daily. Recommend heart healthy diet such as Mediterranean diet with whole grains, fruits, vegetable, fish, lean meats, nuts, and olive oil. Limit salt. Encouraged moderate walking, 3-5 times/week for 30-50 minutes each session. Aim for at least 150 minutes.week. Goal should be pace of 3 miles/hours, or walking 1.5 miles in 30 minutes. Avoid tobacco products. Avoid excess alcohol. Take medications as prescribed and bring medications and blood pressure log with cuff to each office visit. Seek medical care for chest pain, palpitations, shortness of breath with exertion, dizziness/lightheadedness, vision changes, recurrent headaches, or swelling of extremities.

## 2023-01-26 NOTE — Addendum Note (Signed)
Addended by: Arta Silence on: 01/26/2023 04:32 PM   Modules accepted: Orders

## 2023-01-28 LAB — CBC WITH DIFFERENTIAL/PLATELET
Absolute Monocytes: 809 {cells}/uL (ref 200–950)
Basophils Absolute: 39 {cells}/uL (ref 0–200)
Basophils Relative: 0.5 %
Eosinophils Absolute: 254 {cells}/uL (ref 15–500)
Eosinophils Relative: 3.3 %
HCT: 43.8 % (ref 38.5–50.0)
Hemoglobin: 14.8 g/dL (ref 13.2–17.1)
Lymphs Abs: 2133 {cells}/uL (ref 850–3900)
MCH: 30.3 pg (ref 27.0–33.0)
MCHC: 33.8 g/dL (ref 32.0–36.0)
MCV: 89.6 fL (ref 80.0–100.0)
MPV: 9.9 fL (ref 7.5–12.5)
Monocytes Relative: 10.5 %
Neutro Abs: 4466 {cells}/uL (ref 1500–7800)
Neutrophils Relative %: 58 %
Platelets: 200 10*3/uL (ref 140–400)
RBC: 4.89 10*6/uL (ref 4.20–5.80)
RDW: 12.2 % (ref 11.0–15.0)
Total Lymphocyte: 27.7 %
WBC: 7.7 10*3/uL (ref 3.8–10.8)

## 2023-01-28 LAB — COMPLETE METABOLIC PANEL WITH GFR
AG Ratio: 1.7 (calc) (ref 1.0–2.5)
ALT: 13 U/L (ref 9–46)
AST: 17 U/L (ref 10–35)
Albumin: 4.4 g/dL (ref 3.6–5.1)
Alkaline phosphatase (APISO): 97 U/L (ref 35–144)
BUN: 23 mg/dL (ref 7–25)
CO2: 27 mmol/L (ref 20–32)
Calcium: 9.6 mg/dL (ref 8.6–10.3)
Chloride: 101 mmol/L (ref 98–110)
Creat: 1.21 mg/dL (ref 0.70–1.35)
Globulin: 2.6 g/dL (ref 1.9–3.7)
Glucose, Bld: 101 mg/dL — ABNORMAL HIGH (ref 65–99)
Potassium: 4.8 mmol/L (ref 3.5–5.3)
Sodium: 139 mmol/L (ref 135–146)
Total Bilirubin: 0.5 mg/dL (ref 0.2–1.2)
Total Protein: 7 g/dL (ref 6.1–8.1)
eGFR: 66 mL/min/{1.73_m2} (ref >=60)

## 2023-01-28 LAB — TEST AUTHORIZATION

## 2023-01-28 LAB — LIPID PANEL
Cholesterol: 167 mg/dL (ref <200)
HDL: 48 mg/dL (ref >=40)
LDL Cholesterol (Calc): 93 mg/dL
Non-HDL Cholesterol (Calc): 119 mg/dL (ref <130)
Total CHOL/HDL Ratio: 3.5 (calc) (ref <5.0)
Triglycerides: 154 mg/dL — ABNORMAL HIGH (ref <150)

## 2023-01-28 LAB — PSA: PSA: 1.71 ng/mL (ref <=4.00)

## 2023-02-05 ENCOUNTER — Other Ambulatory Visit: Payer: Self-pay | Admitting: Family Medicine

## 2023-03-10 ENCOUNTER — Other Ambulatory Visit: Payer: Self-pay | Admitting: Family Medicine

## 2023-03-17 ENCOUNTER — Ambulatory Visit: Payer: 59 | Admitting: Endocrinology

## 2023-03-17 ENCOUNTER — Encounter: Payer: Self-pay | Admitting: Endocrinology

## 2023-03-17 VITALS — BP 124/60 | HR 57 | Resp 18 | Ht 66.0 in | Wt 171.2 lb

## 2023-03-17 DIAGNOSIS — Z7984 Long term (current) use of oral hypoglycemic drugs: Secondary | ICD-10-CM

## 2023-03-17 DIAGNOSIS — E119 Type 2 diabetes mellitus without complications: Secondary | ICD-10-CM | POA: Diagnosis not present

## 2023-03-17 DIAGNOSIS — Z8639 Personal history of other endocrine, nutritional and metabolic disease: Secondary | ICD-10-CM

## 2023-03-17 MED ORDER — PIOGLITAZONE HCL 15 MG PO TABS: 15.0000 mg | ORAL_TABLET | Freq: Every day | ORAL | 3 refills | Status: AC

## 2023-03-17 MED ORDER — METFORMIN HCL ER 500 MG PO TB24: 1500.0000 mg | ORAL_TABLET | Freq: Every day | ORAL | 3 refills | Status: AC

## 2023-03-17 NOTE — Progress Notes (Signed)
Outpatient Endocrinology Note [place] Emmerson Taddei, MD  03/17/23  Patient's Name: Andrew Morrow    DOB: 1955/10/23    MRN: [id number]                                                    REASON OF VISIT: Follow up of type 2 diabetes mellitus  PCP: Park Meo, FNP  HISTORY OF PRESENT ILLNESS:   Andrew Morrow is a 67 y.o. old male with past medical history listed below, is here for follow up for type 2 diabetes mellitus.   He was previously seen by Dr. Everardo All and Dr. Lucianne Muss.  Pertinent Diabetes History: Patient was diagnosed with type 2 diabetes mellitus in 2011.  He had used insulin in the past between 2011-2014.  Lately not on insulin therapy. He has controlled type 2 diabetes mellitus.   Chronic Diabetes Complications : Retinopathy: no. Last ophthalmology exam was done on 05/2022, following with ophthalmology regularly.  Nephropathy: no, on ACE/ARB / lisinopril Peripheral neuropathy: no Coronary artery disease: no Stroke: on  Relevant comorbidities and cardiovascular risk factors: Obesity: no Body mass index is 27.63 kg/m.  Hypertension: Yes  Hyperlipidemia : Yes, on statin   Current / Home Diabetic regimen includes: Metformin extended release 1500 mg daily. Actos/pioglitazone 15 mg daily.  Prior diabetic medications: Lantus and Humalog in the past in 2011-2014 timeframe.  Glycemic data:   Glucometer One Touch ultra.  Jerelle's glucose meter is computer downloaded. Raw data and trends analyzed.   -  He has been testing his blood glucoses 0.7 times daily.  -  Average glucose for the last 14 days is 101 mg/dl, range 87 - 409. -  Trends noted: Acceptable blood sugar.  Some of the blood sugars are.  99, 92, 87, 104, 93.    Hypoglycemia: Patient has no hypoglycemic episodes. Patient has hypoglycemia awareness.  Factors modifying glucose control: 1.  Diabetic diet assessment: 3 meals a day.  2.  Staying active or exercising: Mostly walking.  Active at work.  3.   Medication compliance: compliant all of the time.  # History of Graves' disease/hyperthyroidism :  - Diagnosed in 2000.  Had taken thionamide treatment, off of the treatments from 2005-2007 timeframe.  Hyperthyroidism reoccurred methimazole was later started, he was on 10 mg daily, had thyrotropin receptor antibody negative in August 2023 and methimazole was stopped at that time.  He has remained euthyroid biochemically and clinically off of methimazole.  Per record he ha no imaging test done in the past.  Interval history  Patient has been taking metformin and the Actos.  He denies symptoms of vision problem.  No numbness and tingling of the feet.  Glucometer data as reviewed above.  Recent hemoglobin A1c 6.2%.  No other complaints today.  REVIEW OF SYSTEMS As per history of present illness.   PAST MEDICAL HISTORY: Past Medical History:  Diagnosis Date   Adenoma of colon    Cataract    forming    Chronic kidney disease    left upper pole stone   Diabetes mellitus without complication (HCC)    diet controlled- pt states uses lants PRN only- no shot in 8 months   Encounter for long-term (current) use of other medications 06/01/2012   Foot pain, left 03/07/2019   Hematuria 07/27/2014   HTN (hypertension)  Hyperlipidemia    Hyperthyroidism    Routine general medical examination at a health care facility 12/01/2012   Screening for cancer 07/27/2014   Screening for prostate cancer 06/01/2012   Unspecified disorder of liver 02/13/2010   Qualifier: Diagnosis of  By: Everardo All MD, Cleophas Dunker     PAST SURGICAL HISTORY: Past Surgical History:  Procedure Laterality Date   COLONOSCOPY     HERNIA REPAIR     KIDNEY STONE SURGERY     POLYPECTOMY     TONSILLECTOMY     VASECTOMY      ALLERGIES: No Known Allergies  FAMILY HISTORY:  Family History  Problem Relation Age of Onset   Throat cancer Mother        heavy smoker    Esophageal cancer Mother    Other Son        Committed Suicide    Diabetes Mellitus I Brother    Colon cancer Neg Hx    Colon polyps Neg Hx    Rectal cancer Neg Hx    Stomach cancer Neg Hx     SOCIAL HISTORY: Social History   Socioeconomic History   Marital status: Married    Spouse name: Not on file   Number of children: Not on file   Years of education: Not on file   Highest education level: Not on file  Occupational History   Not on file  Tobacco Use   Smoking status: Never   Smokeless tobacco: Former    Types: Chew  Substance and Sexual Activity   Alcohol use: No    Alcohol/week: 0.0 standard drinks of alcohol   Drug use: No   Sexual activity: Not on file  Other Topics Concern   Not on file  Social History Narrative   Works as Radio broadcast assistant   Married   Social Determinants of Corporate investment banker Strain: Not on file  Food Insecurity: Not on file  Transportation Needs: Not on file  Physical Activity: Not on file  Stress: Not on file  Social Connections: Unknown (09/24/2021)   Received from [employer], [facility]   Social Network    Social Network: Not on file    MEDICATIONS:  Current Outpatient Medications  Medication Sig Dispense Refill   acetaminophen (TYLENOL) 500 MG tablet Take 500 mg by mouth every 6 (six) hours as needed for moderate pain.     atorvastatin (LIPITOR) 80 MG tablet Take 1 tablet (80 mg total) by mouth daily. 90 tablet 1   lisinopril-hydrochlorothiazide (ZESTORETIC) 20-25 MG tablet TAKE 1 TABLET BY MOUTH EVERY DAY 90 tablet 1   Multiple Vitamin (MULTIVITAMIN) tablet Take 1 tablet by mouth every morning.      ONETOUCH ULTRA test strip USE AS DIRECTED 100 strip 1   sildenafil (VIAGRA) 100 MG tablet TAKE 1 TABLET (100 MG TOTAL) BY MOUTH DAILY AS NEEDED. 10 tablet 3   metFORMIN (GLUCOPHAGE-XR) 500 MG 24 hr tablet Take 3 tablets (1,500 mg total) by mouth daily. 270 tablet 3   pioglitazone (ACTOS) 15 MG tablet Take 1 tablet (15 mg total) by mouth daily. 90 tablet 3   No current  facility-administered medications for this visit.    PHYSICAL EXAM: Vitals:   03/17/23 0806  BP: 124/60  Pulse: (!) 57  Resp: 18  SpO2: 98%  Weight: 171 lb 3.2 oz (77.7 kg)  Height: 5\' 6"  (1.676 m)   Body mass index is 27.63 kg/m.  Wt Readings from Last 3 Encounters:  03/17/23 171  lb 3.2 oz (77.7 kg)  01/26/23 170 lb (77.1 kg)  09/23/22 172 lb (78 kg)    General: Well developed, well nourished male in no apparent distress.  HEENT: AT/[place], no external lesions.  Eyes: Conjunctiva clear and no icterus. Neck: Neck supple  Lungs: Respirations not labored Neurologic: Alert, oriented, normal speech Extremities / Skin: Dry. No sores or rashes noted.  Psychiatric: Does not appear depressed or anxious  Diabetic Foot Exam - Simple   Simple Foot Form Diabetic Foot exam was performed with the following findings: Yes 03/17/2023  8:23 AM  Visual Inspection See comments: Yes Sensation Testing Intact to touch and monofilament testing bilaterally: Yes Pulse Check See comments: Yes Comments Callus on bilateral heels. DP pulses 2+ bilaterally.    LABS Reviewed Lab Results  Component Value Date   HGBA1C 6.2 (H) 01/20/2023   HGBA1C 6.4 09/19/2022   HGBA1C 6.4 05/19/2022   No results found for: "FRUCTOSAMINE" Lab Results  Component Value Date   CHOL 167 01/20/2023   HDL 48 01/20/2023   LDLCALC 93 01/20/2023   LDLDIRECT 79.0 05/19/2022   TRIG 154 (H) 01/20/2023   CHOLHDL 3.5 01/20/2023   Lab Results  Component Value Date   MICRALBCREAT 0.9 09/19/2022   MICRALBCREAT 0.7 03/08/2018   Lab Results  Component Value Date   CREATININE 1.21 01/20/2023   Lab Results  Component Value Date   GFR 59.09 (L) 09/19/2022    Latest Reference Range & Units 12/18/21 08:21  Thyrotropin Receptor Ab 0.00 - 1.75 IU/L <1.10   ASSESSMENT / PLAN  1. Type 2 diabetes mellitus without complication, without long-term current use of insulin (HCC)   2. History of hyperthyroidism     Diabetes  Mellitus type 2, complicated by no known complications. - Diabetic status / severity: Controlled.  Lab Results  Component Value Date   HGBA1C 6.2 (H) 01/20/2023    - Hemoglobin A1c goal : <7%  - Medications: No change.  Metformin extended release 1500 mg daily. Actos/pioglitazone 15 mg daily.  - Home glucose testing: Check at least few times a week in the morning fasting and at bedtime. - Discussed/ Gave Hypoglycemia treatment plan.  # Consult : not required at this time.   # Annual urine for microalbuminuria/ creatinine ratio, no microalbuminuria currently, continue ACE/ARB /lisinopril. Last  Lab Results  Component Value Date   MICRALBCREAT 0.9 09/19/2022    # Foot check nightly.  He has callus on the heels, advised to use padded shoes.  # Annual dilated diabetic eye exams.   - Diet: Make healthy diabetic food choices - Life style / activity / exercise: Discussed.  2. Blood pressure  -  BP Readings from Last 1 Encounters:  03/17/23 124/60    - Control is in target.  - No change in current plans.  3. Lipid status / Hyperlipidemia - Last  Lab Results  Component Value Date   LDLCALC 93 01/20/2023   - Continue atorvastatin 80 mg daily.  # History of hyperthyroidism: -Diagnosed in 2000.  He was on methimazole, and was stopped in August 2023.  He has remained biochemically and clinically euthyroid off of methimazole. -Normal thyroid function test in July 2024. -Will check thyroid function test in 22-month at the time of follow-up.  Diagnoses and all orders for this visit:  Type 2 diabetes mellitus without complication, without long-term current use of insulin (HCC) -     Microalbumin / creatinine urine ratio; Future -     Hemoglobin A1c;  Future -     Basic metabolic panel; Future  History of hyperthyroidism -     T3, free; Future -     T4, free; Future -     TSH; Future  Other orders -     metFORMIN (GLUCOPHAGE-XR) 500 MG 24 hr tablet; Take 3 tablets  (1,500 mg total) by mouth daily. -     pioglitazone (ACTOS) 15 MG tablet; Take 1 tablet (15 mg total) by mouth daily.    DISPOSITION Follow up in clinic in 6 months suggested.   All questions answered and patient verbalized understanding of the plan.  [place] Sheyanne Munley, MD [facility] Endocrinology [facility] [address] [place], [address] [place], [place] [address] Phone # [phone]  At least part of this note was generated using voice recognition software. Inadvertent word errors may have occurred, which were not recognized during the proofreading process.

## 2023-04-13 ENCOUNTER — Other Ambulatory Visit: Payer: Self-pay

## 2023-04-13 ENCOUNTER — Telehealth: Payer: Self-pay

## 2023-04-13 DIAGNOSIS — I10 Essential (primary) hypertension: Secondary | ICD-10-CM

## 2023-04-13 DIAGNOSIS — N529 Male erectile dysfunction, unspecified: Secondary | ICD-10-CM

## 2023-04-13 DIAGNOSIS — E0590 Thyrotoxicosis, unspecified without thyrotoxic crisis or storm: Secondary | ICD-10-CM

## 2023-04-13 MED ORDER — SILDENAFIL CITRATE 100 MG PO TABS: 100.0000 mg | ORAL_TABLET | Freq: Every day | ORAL | 3 refills | Status: AC | PRN

## 2023-04-13 NOTE — Telephone Encounter (Signed)
Refill sent. Mjp,lpn  Copied from CRM [id number]. Topic: Clinical - Medication Refill >> Apr 13, 2023  2:42 PM Louie Boston wrote: Most Recent Primary Care Visit:  Provider: Park Meo  Department: [facility]  Visit Type: PHYSICAL  Date: 01/26/2023  Medication: sildenafil (VIAGRA) 100 MG tablet  Has the patient contacted their pharmacy? Yes (Agent: If no, request that the patient contact the pharmacy for the refill. If patient does not wish to contact the pharmacy document the reason why and proceed with request.) (Agent: If yes, when and what did the pharmacy advise?)  Patient spoke pharmacy and was advised to contact us for refill. Patient is out of medication.    Is this the correct pharmacy for this prescription? Yes If no, delete pharmacy and type the correct one.  This is the patient's preferred pharmacy:  [id number] [place], [place] - [address] [facility] [address] [facility] [address] [place] [address] Phone: [phone] Fax: [phone]   Has the prescription been filled recently? No  Is the patient out of the medication? Yes  Has the patient been seen for an appointment in the last year OR does the patient have an upcoming appointment? Yes  Can we respond through MyChart? Yes  Agent: Please be advised that Rx refills may take up to 3 business days. We ask that you follow-up with your pharmacy.

## 2023-07-22 ENCOUNTER — Other Ambulatory Visit: Payer: 59

## 2023-07-27 ENCOUNTER — Ambulatory Visit: Payer: 59 | Admitting: Family Medicine

## 2023-08-03 ENCOUNTER — Telehealth: Payer: Self-pay

## 2023-08-03 NOTE — Telephone Encounter (Signed)
 Copied from CRM [id number]. Topic: General - Other >> Aug 03, 2023 11:18 AM Andrew Morrow wrote: Reason for CRM:  The patient wanted to Let Kurtis Bushman know that he will no longer see because all of his appointments will be with the [place].

## 2023-08-18 ENCOUNTER — Other Ambulatory Visit: Payer: Self-pay

## 2023-08-18 DIAGNOSIS — E119 Type 2 diabetes mellitus without complications: Secondary | ICD-10-CM

## 2023-08-18 DIAGNOSIS — Z8639 Personal history of other endocrine, nutritional and metabolic disease: Secondary | ICD-10-CM

## 2023-08-28 DIAGNOSIS — E0590 Thyrotoxicosis, unspecified without thyrotoxic crisis or storm: Secondary | ICD-10-CM | POA: Diagnosis not present

## 2023-08-28 DIAGNOSIS — E119 Type 2 diabetes mellitus without complications: Secondary | ICD-10-CM | POA: Diagnosis not present

## 2023-08-28 DIAGNOSIS — R231 Pallor: Secondary | ICD-10-CM | POA: Diagnosis not present

## 2023-09-02 DIAGNOSIS — H3562 Retinal hemorrhage, left eye: Secondary | ICD-10-CM | POA: Diagnosis not present

## 2023-09-09 ENCOUNTER — Other Ambulatory Visit: Payer: 59

## 2023-09-14 ENCOUNTER — Ambulatory Visit: Payer: 59 | Admitting: Endocrinology

## 2024-03-24 ENCOUNTER — Telehealth: Payer: Self-pay

## 2024-03-24 NOTE — Telephone Encounter (Signed)
 Patient is overdue for a follow up with [facility]. Called patient. No answer no machine.

## 2024-03-29 NOTE — Telephone Encounter (Signed)
 Patient is on the [facility] list as not completing a PCP visit for 2025. PCP listed is Jeoffrey Barrio, MD.   Patient spouse stated that he is only going to the [facility] now.   Informed that if [facility] PCP is updated they will attribute him to the correct provider.
